# Patient Record
Sex: Female | Born: 1960 | Race: White | Hispanic: No | Smoking: Never smoker
Health system: Southern US, Community
[De-identification: ages and names within clinical notes are randomized; demographics above are authoritative.]

## PROBLEM LIST (undated history)

## (undated) DIAGNOSIS — K219 Gastro-esophageal reflux disease without esophagitis: Secondary | ICD-10-CM

## (undated) DIAGNOSIS — B019 Varicella without complication: Secondary | ICD-10-CM

## (undated) DIAGNOSIS — E079 Disorder of thyroid, unspecified: Secondary | ICD-10-CM

## (undated) DIAGNOSIS — G5603 Carpal tunnel syndrome, bilateral upper limbs: Secondary | ICD-10-CM

## (undated) DIAGNOSIS — G43909 Migraine, unspecified, not intractable, without status migrainosus: Secondary | ICD-10-CM

## (undated) HISTORY — DX: Migraine, unspecified, not intractable, without status migrainosus: G43.909

## (undated) HISTORY — DX: Gastro-esophageal reflux disease without esophagitis: K21.9

## (undated) HISTORY — DX: Varicella without complication: B01.9

## (undated) HISTORY — DX: Disorder of thyroid, unspecified: E07.9

## (undated) HISTORY — DX: Carpal tunnel syndrome, bilateral upper limbs: G56.03

---
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
## 1988-09-21 HISTORY — PX: HERNIA REPAIR: SHX51

## 2007-09-22 DIAGNOSIS — E039 Hypothyroidism, unspecified: Secondary | ICD-10-CM | POA: Insufficient documentation

## 2018-08-23 ENCOUNTER — Ambulatory Visit: Payer: BLUE CROSS/BLUE SHIELD | Admitting: Family Medicine

## 2018-08-23 ENCOUNTER — Encounter: Payer: Self-pay | Admitting: Family Medicine

## 2018-08-23 VITALS — BP 110/68 | HR 82 | Temp 98.3°F | Ht 64.0 in | Wt 165.6 lb

## 2018-08-23 DIAGNOSIS — E039 Hypothyroidism, unspecified: Secondary | ICD-10-CM

## 2018-08-23 DIAGNOSIS — Z1231 Encounter for screening mammogram for malignant neoplasm of breast: Secondary | ICD-10-CM

## 2018-08-23 LAB — CBC
HCT: 38.1 % (ref 36.0–46.0)
Hemoglobin: 12.7 g/dL (ref 12.0–15.0)
MCHC: 33.3 g/dL (ref 30.0–36.0)
MCV: 87.3 fl (ref 78.0–100.0)
PLATELETS: 228 10*3/uL (ref 150.0–400.0)
RBC: 4.37 Mil/uL (ref 3.87–5.11)
RDW: 13.6 % (ref 11.5–15.5)
WBC: 4.5 10*3/uL (ref 4.0–10.5)

## 2018-08-23 LAB — COMPREHENSIVE METABOLIC PANEL
ALT: 21 U/L (ref 0–35)
AST: 24 U/L (ref 0–37)
Albumin: 4.2 g/dL (ref 3.5–5.2)
Alkaline Phosphatase: 69 U/L (ref 39–117)
BUN: 12 mg/dL (ref 6–23)
CO2: 28 mEq/L (ref 19–32)
Calcium: 9.4 mg/dL (ref 8.4–10.5)
Chloride: 103 mEq/L (ref 96–112)
Creatinine, Ser: 0.8 mg/dL (ref 0.40–1.20)
GFR: 78.58 mL/min (ref >60.00)
Glucose, Bld: 93 mg/dL (ref 70–99)
POTASSIUM: 3.9 meq/L (ref 3.5–5.1)
Sodium: 138 mEq/L (ref 135–145)
Total Bilirubin: 0.6 mg/dL (ref 0.2–1.2)
Total Protein: 7.6 g/dL (ref 6.0–8.3)

## 2018-08-23 MED ORDER — LEVOTHYROXINE SODIUM 88 MCG PO TABS: 88.0000 ug | ORAL_TABLET | Freq: Every day | ORAL | 3 refills | Status: DC

## 2018-08-23 NOTE — Progress Notes (Signed)
Subjective:    Patient ID: Hannah [name], female    DOB: 1961/05/14, 57 y.o.   MRN: [id number]  HPI  Presents to clinic to establish care with PCP.  Patient's only medical history is hypothyroidism, she takes levothyroxine 88 mcg.  For the most part she is remained stable on this dose.  We will get new blood work today to continue monitoring her levels.  Last colonoscopy was in February 2019.  Last mammogram was November 2018.  Last Pap smear was January 2019.  Patient did get the flu shot for this season, last Tdap was in 2013.   Past medical history, social history, surgical history and family history reviewed and updated accordingly in chart: Family History  Problem Relation Age of Onset  . Arthritis Mother   . Hearing loss Mother   . Alcohol abuse Father   . Arthritis Father   . Cancer Father   . Depression Father   . Hearing loss Father   . Heart attack Father   . Hyperlipidemia Father   . Stroke Father   . Hyperlipidemia Sister   . Hyperlipidemia Brother   . Hypertension Brother    Social History   Tobacco Use  . Smoking status: Never Smoker  . Smokeless tobacco: Never Used  Substance Use Topics  . Alcohol use: Never    Frequency: Never   Past Surgical History:  Procedure Laterality Date  . HERNIA REPAIR  1990  . TONSILLECTOMY  1965   Past Medical History:  Diagnosis Date  . Chickenpox   . GERD (gastroesophageal reflux disease)   . Migraine   . Thyroid disease     ROS  Constitutional: Negative for chills, fatigue and fever.  HENT: Negative for congestion, ear pain, sinus pain and sore throat.   Eyes: Negative.   Respiratory: Negative for cough, shortness of breath and wheezing.   Cardiovascular: Negative for chest pain, palpitations and leg swelling.  Gastrointestinal: Negative for abdominal pain, diarrhea, nausea and vomiting.  Genitourinary: Negative for dysuria, frequency and urgency.  Musculoskeletal: Negative for arthralgias and myalgias.    Skin: Negative for color change, pallor and rash.  Neurological: Negative for syncope, light-headedness and headaches.  Psychiatric/Behavioral: The patient is not nervous/anxious.       Objective:    Physical Exam  Constitutional: She appears well-developed and well-nourished. No distress.  HENT:  Head: Normocephalic and atraumatic.  Eyes: Pupils are equal, round, and reactive to light. EOM are normal. No scleral icterus.  Neck: Normal range of motion. Neck supple. No tracheal deviation present. No thyromegaly. Cardiovascular: Normal rate, regular rhythm and normal heart sounds.  Pulmonary/Chest: Effort normal and breath sounds normal. No respiratory distress. She has no wheezes. She has no rales.  Abdominal: Soft. Bowel sounds are normal. There is no tenderness.  Neurological: She is alert and oriented to person, place, and time.  Gait normal  Skin: Skin is warm and dry. No pallor.  Psychiatric: She has a normal mood and affect. Her behavior is normal. Thought content normal.   Breast exam declined in clinic today.  Nursing note and vitals reviewed.    Vitals:   08/23/18 0840  BP: 110/68  Pulse: 82  Temp: 98.3 F (36.8 C)  SpO2: 97%   Assessment & Plan:   Hypothyroidism- we will get new lab work today to follow-up on thyroid levels.  And lab work will also include CBC, CMP and lipid panel.  Patient given refill of current levothyroxine dose.  Screening mammogram- patient  usually gets screening mammogram annually, mammogram order placed.  Follow up in 6 months. Return to clinic sooner if any issues arise.

## 2018-08-24 LAB — THYROID PANEL WITH TSH
FREE THYROXINE INDEX: 3.7 (ref 1.4–3.8)
T3 Uptake: 32 % (ref 22–35)
T4, Total: 11.5 ug/dL (ref 5.1–11.9)
TSH: 2.52 m[IU]/L (ref 0.40–4.50)

## 2018-09-12 ENCOUNTER — Telehealth: Payer: Self-pay | Admitting: *Deleted

## 2018-09-12 NOTE — Telephone Encounter (Signed)
This is fine with me   

## 2018-09-12 NOTE — Telephone Encounter (Signed)
FYI Pt is requesting to transfer FROM: Lauren Guse Pt is requesting to transfer TO: Margarett Arnett Reason for requested transfer: just doesn't seem like a good fit.  Send CRM to patient's current PCP (transferring FROM).

## 2018-09-12 NOTE — Telephone Encounter (Signed)
Copied from CRM [id number]. Topic: Appointment Scheduling - Transfer of Care >> Sep 12, 2018  3:51 PM Baldo DaubAlexander, Amber L wrote: Pt is requesting to transfer FROM: Lauren Guse Pt is requesting to transfer TO: Margarett Arnett Reason for requested transfer: just doesn't seem like a good fit.  Send CRM to patient's current PCP (transferring FROM).

## 2018-09-15 ENCOUNTER — Telehealth: Payer: Self-pay

## 2018-09-15 NOTE — Telephone Encounter (Signed)
Waiting for Arnett to approve, can not schedule until approval from her.

## 2018-09-15 NOTE — Telephone Encounter (Signed)
Copied from CRM [id number]. Topic: General - Call Back - No Documentation >> Sep 15, 2018  4:07 PM Angela NevinWilliams, Candice N wrote: Reason for CRM: Patient is returning call to office- no documentation

## 2018-09-16 NOTE — Telephone Encounter (Signed)
okay

## 2018-10-10 ENCOUNTER — Encounter: Payer: Self-pay | Admitting: Family

## 2018-10-24 ENCOUNTER — Other Ambulatory Visit: Payer: Self-pay | Admitting: Family Medicine

## 2018-10-24 DIAGNOSIS — Z1231 Encounter for screening mammogram for malignant neoplasm of breast: Secondary | ICD-10-CM

## 2018-11-16 ENCOUNTER — Ambulatory Visit
Admission: RE | Admit: 2018-11-16 | Discharge: 2018-11-16 | Disposition: A | Discharge status: Home / Self Care | Payer: BLUE CROSS/BLUE SHIELD | Source: Ambulatory Visit | Attending: Family Medicine | Admitting: Family Medicine

## 2018-11-16 ENCOUNTER — Encounter (HOSPITAL_COMMUNITY): Payer: Self-pay

## 2018-11-16 DIAGNOSIS — Z1231 Encounter for screening mammogram for malignant neoplasm of breast: Secondary | ICD-10-CM | POA: Diagnosis not present

## 2018-11-22 NOTE — Progress Notes (Signed)
Called [facility] for Pt results, and they stated they are waiting on  Pt past results from [place].

## 2019-02-24 ENCOUNTER — Telehealth: Payer: Self-pay | Admitting: Family

## 2019-02-24 ENCOUNTER — Other Ambulatory Visit: Payer: Self-pay

## 2019-02-24 DIAGNOSIS — E039 Hypothyroidism, unspecified: Secondary | ICD-10-CM

## 2019-02-24 MED ORDER — LEVOTHYROXINE SODIUM 88 MCG PO TABS: 88.0000 ug | ORAL_TABLET | Freq: Every day | ORAL | 3 refills | Status: DC

## 2019-02-24 NOTE — Telephone Encounter (Signed)
I have refilled and sent to [facility].

## 2019-02-24 NOTE — Telephone Encounter (Signed)
Copied from CRM [id number]. Topic: Quick Communication - Rx Refill/Question >> Feb 24, 2019 11:12 AM Jay Schlichter wrote: Medication:levothyroxine (SYNTHROID, LEVOTHROID) 88 MCG tablet  Has the patient contacted their pharmacy? Yes.   (Agent: If no, request that the patient contact the pharmacy for the refill.) (Agent: If yes, when and what did the pharmacy advise?)  Preferred Pharmacy (with phone number or street name): [facility]   Agent: Please be advised that RX refills may take up to 3 business days. We ask that you follow-up with your pharmacy.

## 2019-02-24 NOTE — Telephone Encounter (Signed)
Refill synthroid

## 2019-11-23 ENCOUNTER — Other Ambulatory Visit: Payer: Self-pay

## 2019-11-28 ENCOUNTER — Other Ambulatory Visit: Payer: Self-pay

## 2019-11-28 ENCOUNTER — Ambulatory Visit: Payer: BC Managed Care – PPO | Admitting: Nurse Practitioner

## 2019-11-28 ENCOUNTER — Encounter: Payer: Self-pay | Admitting: Nurse Practitioner

## 2019-11-28 VITALS — BP 114/68 | HR 84 | Temp 97.5°F | Resp 16 | Ht 65.0 in | Wt 167.4 lb

## 2019-11-28 DIAGNOSIS — G5603 Carpal tunnel syndrome, bilateral upper limbs: Secondary | ICD-10-CM | POA: Diagnosis not present

## 2019-11-28 DIAGNOSIS — E039 Hypothyroidism, unspecified: Secondary | ICD-10-CM | POA: Diagnosis not present

## 2019-11-28 DIAGNOSIS — I83811 Varicose veins of right lower extremities with pain: Secondary | ICD-10-CM | POA: Insufficient documentation

## 2019-11-28 DIAGNOSIS — K219 Gastro-esophageal reflux disease without esophagitis: Secondary | ICD-10-CM | POA: Insufficient documentation

## 2019-11-28 DIAGNOSIS — I8391 Asymptomatic varicose veins of right lower extremity: Secondary | ICD-10-CM

## 2019-11-28 DIAGNOSIS — M189 Osteoarthritis of first carpometacarpal joint, unspecified: Secondary | ICD-10-CM | POA: Diagnosis not present

## 2019-11-28 HISTORY — DX: Hypothyroidism, unspecified: E03.9

## 2019-11-28 HISTORY — DX: Asymptomatic varicose veins of right lower extremity: I83.91

## 2019-11-28 LAB — COMPREHENSIVE METABOLIC PANEL
ALT: 10 U/L (ref 0–35)
AST: 17 U/L (ref 0–37)
Albumin: 4 g/dL (ref 3.5–5.2)
Alkaline Phosphatase: 75 U/L (ref 39–117)
BUN: 11 mg/dL (ref 6–23)
CO2: 30 mEq/L (ref 19–32)
Calcium: 9.2 mg/dL (ref 8.4–10.5)
Chloride: 104 mEq/L (ref 96–112)
Creatinine, Ser: 0.77 mg/dL (ref 0.40–1.20)
GFR: 76.92 mL/min (ref >60.00)
Glucose, Bld: 91 mg/dL (ref 70–99)
Potassium: 4 mEq/L (ref 3.5–5.1)
Sodium: 139 mEq/L (ref 135–145)
Total Bilirubin: 0.5 mg/dL (ref 0.2–1.2)
Total Protein: 6.9 g/dL (ref 6.0–8.3)

## 2019-11-28 LAB — TSH: TSH: 2.5 u[IU]/mL (ref 0.35–4.50)

## 2019-11-28 LAB — LIPID PANEL
Cholesterol: 197 mg/dL (ref 0–200)
HDL: 43.4 mg/dL (ref >39.00)
LDL Cholesterol: 139 mg/dL — ABNORMAL HIGH (ref 0–99)
NonHDL: 153.3
Total CHOL/HDL Ratio: 5
Triglycerides: 74 mg/dL (ref 0.0–149.0)
VLDL: 14.8 mg/dL (ref 0.0–40.0)

## 2019-11-28 LAB — CBC
HCT: 35.9 % — ABNORMAL LOW (ref 36.0–46.0)
Hemoglobin: 12 g/dL (ref 12.0–15.0)
MCHC: 33.5 g/dL (ref 30.0–36.0)
MCV: 87.5 fl (ref 78.0–100.0)
Platelets: 202 10*3/uL (ref 150.0–400.0)
RBC: 4.11 Mil/uL (ref 3.87–5.11)
RDW: 13.6 % (ref 11.5–15.5)
WBC: 3.9 10*3/uL — ABNORMAL LOW (ref 4.0–10.5)

## 2019-11-28 MED ORDER — LEVOTHYROXINE SODIUM 88 MCG PO TABS: 88.0000 ug | ORAL_TABLET | Freq: Every day | ORAL | 3 refills | Status: DC

## 2019-11-28 NOTE — Patient Instructions (Addendum)
It was wonderful to meet you today.  1. Please go to lab today. Will check thyroid and routine labs.  2. I placed referral for Dr. Wyn Quaker- in vascular to check your varicose veins. Let us know if you have not heard from their office in 2 weeks. 3. Please get GI records if able.  4. Please follow up for physical exam in 3 mos .  Addenum:  Your chemistry panel and thyroid labs returned normal. Your LDL was elevated on the cholesterol panel. This needs to be treated with low-fat diet and exercise and repeated as a fasting lipid panel in 3 mos.  The WBC was slightly low- and this is a dynamic value. Sometimes we see it when it is at its low point and then on re-check it is in the normal range. I would repeat the CBC in 3 months.   Continue on your same levothyroxine dose and take it  every day, with water, >30 minutes before breakfast, separated by >4 hours from acid reflux medications, calcium, iron, multivitamins.

## 2019-11-28 NOTE — Progress Notes (Signed)
Subjective:    Patient ID: Hannah [name], female    DOB: 22-Feb-1961, 59 y.o.   MRN: [id number]  HPI This 59 yo patient comes in to establish care and get refills of her thyroid medicine.  Hypothyroidism:  She is on levothyroxine 88 mcg and is doing well without any side effects.   GERD reports EGD in 2019-takes Prilosec only a needed and is doing well. She reports esophageal scar tissue.   History of  polyps per colonoscopy  with 10 year follow up reported. FH sister with colon polyps on 5 year plan.    Varicose veins Rt leg- 2018 vein twist and no DVT. She saw vascular doctor >25 years ago. She gets pain after being on her feet 10.5 hours per day. She no longer wears support hose.   Carpal tunnel- seeing hand surgeon and plans to have bilateral surgery scheduled.   Past Medical History:  Diagnosis Date  . Carpal tunnel syndrome, bilateral   . Chickenpox   . GERD (gastroesophageal reflux disease)   . Hypothyroidism 11/28/2019  . Migraine   . Thyroid disease   . Varicose veins of right lower extremity 11/28/2019     Past Surgical History:  Procedure Laterality Date  . HERNIA REPAIR  1990  . TONSILLECTOMY  1965    Family History  Problem Relation Age of Onset  . Arthritis Mother   . Hearing loss Mother   . Alcohol abuse Father   . Arthritis Father   . Cancer Father   . Depression Father   . Hearing loss Father   . Heart attack Father   . Hyperlipidemia Father   . Stroke Father   . Hyperlipidemia Sister   . Colon polyps Sister   . Hyperlipidemia Brother   . Hypertension Brother   . Breast cancer Paternal Aunt     Allergies  Allergen Reactions  . Codeine Nausea And Vomiting and Rash     current outpatient medications on file prior to visit.  Levothyroxine 88 mcg daily   BP 114/68   Pulse 84   Temp (!) 97.5 F (36.4 C) (Temporal)   Resp 16   Ht 5\' 5"  (1.651 m)   Wt 167 lb 6.4 oz (75.9 kg)   SpO2 96%   BMI 27.86 kg/m    Review of Systems    Constitutional: Negative for activity change, chills, fatigue and fever.  HENT: Negative for congestion, sinus pressure, sore throat and trouble swallowing.   Respiratory: Negative for cough and shortness of breath.   Cardiovascular: Positive for leg swelling. Negative for chest pain and palpitations.       Tight shoes if on feet >2 hours. Goes down in the morning.   Gastrointestinal: Negative for abdominal pain, constipation, diarrhea, nausea and vomiting.  Endocrine: Positive for cold intolerance. Negative for heat intolerance, polydipsia and polyuria.  Genitourinary: Negative for difficulty urinating, frequency and urgency.  Musculoskeletal:       Carpal tunnel bilat onset 18 years seeing ortho surgeon as she needs it fixed.  Skin: Negative.   Neurological: Negative.   Hematological: Negative.   Psychiatric/Behavioral:       Denies concerns about depression/anxiety. No suicidal and homicidal ideation.       Objective:   Physical Exam Vitals reviewed.  Constitutional:      Appearance: Normal appearance.  HENT:     Head: Normocephalic.  Cardiovascular:     Rate and Rhythm: Normal rate and regular rhythm.     Heart sounds:  Normal heart sounds.  Pulmonary:     Effort: Pulmonary effort is normal.     Breath sounds: Normal breath sounds.  Abdominal:     Palpations: Abdomen is soft.     Tenderness: There is no abdominal tenderness.  Musculoskeletal:        General: Normal range of motion.     Cervical back: Normal range of motion.     Comments: Large varicose veins right lower leg/ankle   Skin:    General: Skin is warm and dry.  Neurological:     General: No focal deficit present.     Mental Status: She is alert and oriented to person, place, and time.  Psychiatric:        Mood and Affect: Mood normal.     Comments: Tearful when discussing ex husband and his health problems.        Assessment & Plan:   1. Hypothyroidism: labs today and refilled levothyroxine Rx.  2.  Varicose veins- large, minimally bothersome.referral for Dr. Lucky Cowboy- in vascular to check your varicose veins. Let us know if you have not heard from their office in 2 weeks.  3. Hx of GERD- occasional PPI 4. FH colon polyps in sister. Hannah [name] had a colonoscopy in 2019 and was told to repeat  in 10 years. Please get GI records if able.  5.  Please follow up for physical exam in 3 mos   This visit occurred during the SARS-CoV-2 public health emergency.  Safety protocols were in place, including screening questions prior to the visit, additional usage of staff PPE, and extensive cleaning of exam room while observing appropriate contact time as indicated for disinfecting solutions.   I spent 40 min face to face time with this patient.   Denice Paradise, ANP

## 2019-11-29 ENCOUNTER — Other Ambulatory Visit: Payer: Self-pay | Admitting: Lab

## 2019-11-29 ENCOUNTER — Encounter: Payer: Self-pay | Admitting: Nurse Practitioner

## 2020-01-03 ENCOUNTER — Encounter: Payer: BLUE CROSS/BLUE SHIELD | Admitting: Family

## 2020-01-15 ENCOUNTER — Other Ambulatory Visit (INDEPENDENT_AMBULATORY_CARE_PROVIDER_SITE_OTHER): Payer: Self-pay | Admitting: Vascular Surgery

## 2020-01-15 DIAGNOSIS — I8391 Asymptomatic varicose veins of right lower extremity: Secondary | ICD-10-CM

## 2020-01-16 ENCOUNTER — Encounter (INDEPENDENT_AMBULATORY_CARE_PROVIDER_SITE_OTHER): Payer: Self-pay

## 2020-01-16 ENCOUNTER — Other Ambulatory Visit: Payer: Self-pay

## 2020-01-16 ENCOUNTER — Ambulatory Visit (INDEPENDENT_AMBULATORY_CARE_PROVIDER_SITE_OTHER): Payer: BC Managed Care – PPO

## 2020-01-16 ENCOUNTER — Ambulatory Visit (INDEPENDENT_AMBULATORY_CARE_PROVIDER_SITE_OTHER): Payer: BC Managed Care – PPO | Admitting: Vascular Surgery

## 2020-01-16 ENCOUNTER — Encounter (INDEPENDENT_AMBULATORY_CARE_PROVIDER_SITE_OTHER): Payer: Self-pay | Admitting: Vascular Surgery

## 2020-01-16 VITALS — BP 121/79 | HR 74 | Resp 17 | Ht 65.0 in | Wt 160.0 lb

## 2020-01-16 DIAGNOSIS — I83811 Varicose veins of right lower extremities with pain: Secondary | ICD-10-CM | POA: Diagnosis not present

## 2020-01-16 DIAGNOSIS — I8391 Asymptomatic varicose veins of right lower extremity: Secondary | ICD-10-CM | POA: Diagnosis not present

## 2020-01-16 NOTE — Patient Instructions (Signed)

## 2020-01-16 NOTE — Progress Notes (Signed)
Patient ID: Hannah [name], female   DOB: 02-16-1961, 59 y.o.   MRN: [id number]  Chief Complaint  Patient presents with  . Follow-up    ultrasound    HPI Hannah [name] is a 59 y.o. female.  I am asked to see the patient by K. Jerelene Redden, NP for evaluation of painful varicose veins of the right lower extremity.  This has been something present for a good period of time is become more bothersome over the last year or 2.  No previous history of DVT or superficial thrombophlebitis.  No chest pain or shortness of breath.  No symptoms on the left leg.  This is associated with swelling and heaviness in the right leg.  The symptoms are worse in the.  Reflux study done today shows no evidence of DVT or superficial thrombophlebitis.  The great saphenous vein does not have reflux.  The small saphenous vein has severe reflux extending into the popliteal vein with reflux and is enlarged.  She has already been wearing compression stockings, elevating her legs, and maintaining a good activity level   Past Medical History:  Diagnosis Date  . Carpal tunnel syndrome, bilateral   . Chickenpox   . GERD (gastroesophageal reflux disease)   . Hypothyroidism 11/28/2019  . Migraine   . Thyroid disease   . Varicose veins of right lower extremity 11/28/2019    Past Surgical History:  Procedure Laterality Date  . HERNIA REPAIR  1990  . TONSILLECTOMY  1965     Family History  Problem Relation Age of Onset  . Arthritis Mother   . Hearing loss Mother   . Alcohol abuse Father   . Arthritis Father   . Cancer Father   . Depression Father   . Hearing loss Father   . Heart attack Father   . Hyperlipidemia Father   . Stroke Father   . Hyperlipidemia Sister   . Colon polyps Sister   . Hyperlipidemia Brother   . Hypertension Brother   . Breast cancer Paternal Aunt       Social History   Tobacco Use  . Smoking status: Never Smoker  . Smokeless tobacco: Never Used  Substance Use Topics  . Alcohol use: Never    . Drug use: Never    Allergies  Allergen Reactions  . Codeine Nausea And Vomiting and Rash    Current Outpatient Medications  Medication Sig Dispense Refill  . levothyroxine (SYNTHROID) 88 MCG tablet Take 1 tablet (88 mcg total) by mouth daily. 90 tablet 3   No current facility-administered medications for this visit.      REVIEW OF SYSTEMS (Negative unless checked)  Constitutional: '[]' Weight loss  '[]' Fever  '[]' Chills Cardiac: '[]' Chest pain   '[]' Chest pressure   '[]' Palpitations   '[]' Shortness of breath when laying flat   '[]' Shortness of breath at rest   '[]' Shortness of breath with exertion. Vascular:  '[]' Pain in legs with walking   '[]' Pain in legs at rest   '[]' Pain in legs when laying flat   '[]' Claudication   '[]' Pain in feet when walking  '[]' Pain in feet at rest  '[]' Pain in feet when laying flat   '[]' History of DVT   '[]' Phlebitis   '[x]' Swelling in legs   '[x]' Varicose veins   '[]' Non-healing ulcers Pulmonary:   '[]' Uses home oxygen   '[]' Productive cough   '[]' Hemoptysis   '[]' Wheeze  '[]' COPD   '[]' Asthma Neurologic:  '[]' Dizziness  '[]' Blackouts   '[]' Seizures   '[]' History of stroke   '[]' History of TIA  '[]'   Aphasia   '[]' Temporary blindness   '[]' Dysphagia   '[]' Weakness or numbness in arms   '[]' Weakness or numbness in legs Musculoskeletal:  '[]' Arthritis   '[]' Joint swelling   '[]' Joint pain   '[]' Low back pain Hematologic:  '[]' Easy bruising  '[]' Easy bleeding   '[]' Hypercoagulable state   '[]' Anemic  '[]' Hepatitis Gastrointestinal:  '[]' Blood in stool   '[]' Vomiting blood  '[x]' Gastroesophageal reflux/heartburn   '[]' Abdominal pain Genitourinary:  '[]' Chronic kidney disease   '[]' Difficult urination  '[]' Frequent urination  '[]' Burning with urination   '[]' Hematuria Skin:  '[]' Rashes   '[]' Ulcers   '[]' Wounds Psychological:  '[]' History of anxiety   '[]'  History of major depression.    Physical Exam BP 121/79 (BP Location: Right Arm)   Pulse 74   Resp 17   Ht '5\' 5"'  (1.651 m)   Wt 160 lb (72.6 kg)   BMI 26.63 kg/m  Gen:  WD/WN, NAD. Appears younger than stated  age. Head: [place]/AT, No temporalis wasting.  Ear/Nose/Throat: Hearing grossly intact, nares w/o erythema or drainage, oropharynx w/o Erythema/Exudate Eyes: Conjunctiva clear, sclera non-icteric  Neck: trachea midline.  No JVD.  Pulmonary:  Good air movement, respirations not labored, no use of accessory muscles  Cardiac: RRR, no JVD Vascular: Extensive, prominent varicosities in the right posterior calf which are tortuous and extend from the proximal calf to the ankle level. Vessel Right Left  Radial Palpable Palpable                          DP 2+ 2+  PT 1+ 2+   Gastrointestinal:. No masses, surgical incisions, or scars. Musculoskeletal: M/S 5/5 throughout.  Extremities without ischemic changes.  No deformity or atrophy. 1+ RLE edema. Neurologic: Sensation grossly intact in extremities.  Symmetrical.  Speech is fluent. Motor exam as listed above. Psychiatric: Judgment intact, Mood & affect appropriate for pt's clinical situation. Dermatologic: No rashes or ulcers noted.  No cellulitis or open wounds.    Radiology No results found.  Labs Recent Results (from the past 2160 hour(s))  CBC     Status: Abnormal   Collection Time: 11/28/19  8:57 AM  Result Value Ref Range   WBC 3.9 (L) 4.0 - 10.5 K/uL   RBC 4.11 3.87 - 5.11 Mil/uL   Platelets 202.0 150.0 - 400.0 K/uL   Hemoglobin 12.0 12.0 - 15.0 g/dL   HCT 35.9 (L) 36.0 - 46.0 %   MCV 87.5 78.0 - 100.0 fl   MCHC 33.5 30.0 - 36.0 g/dL   RDW 13.6 11.5 - 15.5 %  Comp Met (CMET)     Status: None   Collection Time: 11/28/19  8:57 AM  Result Value Ref Range   Sodium 139 135 - 145 mEq/L   Potassium 4.0 3.5 - 5.1 mEq/L   Chloride 104 96 - 112 mEq/L   CO2 30 19 - 32 mEq/L   Glucose, Bld 91 70 - 99 mg/dL   BUN 11 6 - 23 mg/dL   Creatinine, Ser 0.77 0.40 - 1.20 mg/dL   Total Bilirubin 0.5 0.2 - 1.2 mg/dL   Alkaline Phosphatase 75 39 - 117 U/L   AST 17 0 - 37 U/L   ALT 10 0 - 35 U/L   Total Protein 6.9 6.0 - 8.3 g/dL   Albumin  4.0 3.5 - 5.2 g/dL   GFR 76.92 >60.00 mL/min   Calcium 9.2 8.4 - 10.5 mg/dL  TSH     Status: None   Collection Time: 11/28/19  8:57  AM  Result Value Ref Range   TSH 2.50 0.35 - 4.50 uIU/mL  Lipid Profile     Status: Abnormal   Collection Time: 11/28/19  8:57 AM  Result Value Ref Range   Cholesterol 197 0 - 200 mg/dL    Comment: ATP III Classification       Desirable:  < 200 mg/dL               Borderline High:  200 - 239 mg/dL          High:  > = 240 mg/dL   Triglycerides 74.0 0.0 - 149.0 mg/dL    Comment: Normal:  <150 mg/dLBorderline High:  150 - 199 mg/dL   HDL 43.40 >39.00 mg/dL   VLDL 14.8 0.0 - 40.0 mg/dL   LDL Cholesterol 139 (H) 0 - 99 mg/dL   Total CHOL/HDL Ratio 5     Comment:                Men          Women1/2 Average Risk     3.4          3.3Average Risk          5.0          4.42X Average Risk          9.6          7.13X Average Risk          15.0          11.0                       NonHDL 153.30     Comment: NOTE:  Non-HDL goal should be 30 mg/dL higher than patient's LDL goal (i.e. LDL goal of < 70 mg/dL, would have non-HDL goal of < 100 mg/dL)    Assessment/Plan:  Varicose veins of leg with pain, right Reflux study done today shows no evidence of DVT or superficial thrombophlebitis.  The great saphenous vein does not have reflux.  The small saphenous vein has severe reflux extending into the popliteal vein with reflux and is enlarged.   Although she has already started conservative therapies, with this being her initial visit with [place] we will continue the conservative therapies of 20 to 30 mmHg compression stockings, leg elevation, vigorous activity, and anti-inflammatories as needed for discomfort.  We did discuss laser ablation of the small saphenous vein if she does not have improvement with conservative therapies, I would recommend right small saphenous vein laser ablation.      Leotis Pain 01/16/2020, 4:50 PM   This note was created with Dragon medical  transcription system.  Any errors from dictation are unintentional.

## 2020-01-16 NOTE — Assessment & Plan Note (Signed)
Reflux study done today shows no evidence of DVT or superficial thrombophlebitis.  The great saphenous vein does not have reflux.  The small saphenous vein has severe reflux extending into the popliteal vein with reflux and is enlarged.   Although she has already started conservative therapies, with this being her initial visit with [place] we will continue the conservative therapies of 20 to 30 mmHg compression stockings, leg elevation, vigorous activity, and anti-inflammatories as needed for discomfort.  We did discuss laser ablation of the small saphenous vein if she does not have improvement with conservative therapies, I would recommend right small saphenous vein laser ablation.

## 2020-02-08 ENCOUNTER — Other Ambulatory Visit: Payer: Self-pay | Admitting: Nurse Practitioner

## 2020-02-08 DIAGNOSIS — Z1231 Encounter for screening mammogram for malignant neoplasm of breast: Secondary | ICD-10-CM

## 2020-02-08 DIAGNOSIS — M180 Bilateral primary osteoarthritis of first carpometacarpal joints: Secondary | ICD-10-CM | POA: Diagnosis not present

## 2020-02-09 ENCOUNTER — Other Ambulatory Visit: Payer: Self-pay | Admitting: Family

## 2020-02-09 DIAGNOSIS — E039 Hypothyroidism, unspecified: Secondary | ICD-10-CM

## 2020-02-20 ENCOUNTER — Ambulatory Visit
Admission: RE | Admit: 2020-02-20 | Discharge: 2020-02-20 | Disposition: A | Discharge status: Home / Self Care | Payer: BC Managed Care – PPO | Source: Ambulatory Visit | Attending: Nurse Practitioner | Admitting: Nurse Practitioner

## 2020-02-20 DIAGNOSIS — Z1231 Encounter for screening mammogram for malignant neoplasm of breast: Secondary | ICD-10-CM | POA: Diagnosis not present

## 2020-02-21 ENCOUNTER — Ambulatory Visit: Payer: BC Managed Care – PPO | Admitting: Nurse Practitioner

## 2020-02-21 ENCOUNTER — Encounter: Payer: Self-pay | Admitting: Nurse Practitioner

## 2020-02-21 ENCOUNTER — Other Ambulatory Visit: Payer: Self-pay

## 2020-02-21 ENCOUNTER — Other Ambulatory Visit: Payer: BC Managed Care – PPO

## 2020-02-21 VITALS — BP 110/70 | HR 75 | Temp 97.6°F | Ht 65.0 in | Wt 162.2 lb

## 2020-02-21 DIAGNOSIS — R7989 Other specified abnormal findings of blood chemistry: Secondary | ICD-10-CM | POA: Diagnosis not present

## 2020-02-21 DIAGNOSIS — E039 Hypothyroidism, unspecified: Secondary | ICD-10-CM

## 2020-02-21 DIAGNOSIS — E663 Overweight: Secondary | ICD-10-CM | POA: Diagnosis not present

## 2020-02-21 DIAGNOSIS — E785 Hyperlipidemia, unspecified: Secondary | ICD-10-CM | POA: Diagnosis not present

## 2020-02-21 DIAGNOSIS — K219 Gastro-esophageal reflux disease without esophagitis: Secondary | ICD-10-CM

## 2020-02-21 LAB — CBC WITH DIFFERENTIAL/PLATELET
Basophils Absolute: 0.1 10*3/uL (ref 0.0–0.1)
Basophils Relative: 1.2 % (ref 0.0–3.0)
Eosinophils Absolute: 0.1 10*3/uL (ref 0.0–0.7)
Eosinophils Relative: 2.3 % (ref 0.0–5.0)
HCT: 36.5 % (ref 36.0–46.0)
Hemoglobin: 12.2 g/dL (ref 12.0–15.0)
Lymphocytes Relative: 26.5 % (ref 12.0–46.0)
Lymphs Abs: 1.2 10*3/uL (ref 0.7–4.0)
MCHC: 33.5 g/dL (ref 30.0–36.0)
MCV: 86.8 fl (ref 78.0–100.0)
Monocytes Absolute: 0.7 10*3/uL (ref 0.1–1.0)
Monocytes Relative: 14 % — ABNORMAL HIGH (ref 3.0–12.0)
Neutro Abs: 2.6 10*3/uL (ref 1.4–7.7)
Neutrophils Relative %: 56 % (ref 43.0–77.0)
Platelets: 215 10*3/uL (ref 150.0–400.0)
RBC: 4.2 Mil/uL (ref 3.87–5.11)
RDW: 14.1 % (ref 11.5–15.5)
WBC: 4.7 10*3/uL (ref 4.0–10.5)

## 2020-02-21 LAB — LIPID PANEL
Cholesterol: 203 mg/dL — ABNORMAL HIGH (ref 0–200)
HDL: 45.5 mg/dL (ref >39.00)
LDL Cholesterol: 147 mg/dL — ABNORMAL HIGH (ref 0–99)
NonHDL: 157.61
Total CHOL/HDL Ratio: 4
Triglycerides: 55 mg/dL (ref 0.0–149.0)
VLDL: 11 mg/dL (ref 0.0–40.0)

## 2020-02-21 NOTE — Assessment & Plan Note (Signed)
Recheck labs 

## 2020-02-21 NOTE — Progress Notes (Signed)
Established Patient Office Visit  Subjective:  Patient ID: Hannah [name], female    DOB: [birth year]  Age: 59 y.o. MRN: [id number]  CC:  Chief Complaint  Patient presents with  . Follow-up    HPI Hannah [name] presents for a 3 month visit. She is feeling well today without any specific complaints.  Hypothyroidism: stable on levothyroxine 88 mcg daily  Lab Results  Component Value Date   TSH 2.50 11/28/2019    HLD-not at goal with LDL <100 Overweight: BMI 26.99: Goal BMI <25 Patient with elevated LDL 139-147 range. Declines Crestor and is working on [employer] and exercise management. Lab Results  Component Value Date   CHOL 203 (H) 02/21/2020   HDL 45.50 02/21/2020   LDLCALC 147 (H) 02/21/2020   TRIG 55.0 02/21/2020   CHOLHDL 4 02/21/2020    Varicose veins: Saw Dr. Lucky Cowboy and placed her on low dose ASA. Plans to have laser ablation of varicose veins.   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 CTD- to be monitored . Cortisone into bilat thumb joints last Wed and it has helped and wears braces at night and at work.   Health maintenance: Immunizations: Covid x2- not sure of date, Needs Shingirx, not sure about TD, no PNA.  Diet:healthy Exercise: yes Colonoscopy:Colonoscopy 2019 [place]. Get records [facility] Dexa: Pap Smear: making appt with Dr. Star Age  Mammogram: 02/20/2020;results pending Vision: needs done Dentist UTD   Past Medical History:  Diagnosis Date  . Carpal tunnel syndrome, bilateral   . Chickenpox   . GERD (gastroesophageal reflux disease)   . Hypothyroidism 11/28/2019  . Migraine   . Thyroid disease   . Varicose veins of right lower extremity 11/28/2019    Past Surgical History:  Procedure Laterality Date  . HERNIA REPAIR  1990  . TONSILLECTOMY  1965    Family History  Problem Relation Age of Onset  . Arthritis Mother   . Hearing loss Mother   . Alcohol abuse Father   . Arthritis Father     . Cancer Father   . Depression Father   . Hearing loss Father   . Heart attack Father   . Hyperlipidemia Father   . Stroke Father   . Hyperlipidemia Sister   . Colon polyps Sister   . Hyperlipidemia Brother   . Hypertension Brother   . Breast cancer Paternal Aunt     Social History   Socioeconomic History  . Marital status: Legally Separated    Spouse name: Not on file  . Number of children: Not on file  . Years of education: Not on file  . Highest education level: Not on file  Occupational History  . Not on file  Tobacco Use  . Smoking status: Never Smoker  . Smokeless tobacco: Never Used  Substance and Sexual Activity  . Alcohol use: Never  . Drug use: Never  . Sexual activity: Not Currently  Other Topics Concern  . Not on file  Social History Narrative  . Not on file   Social Determinants of Health   Financial Resource Strain:   . Difficulty of Paying Living Expenses:   Food Insecurity:   . Worried About Charity fundraiser in the Last Year:   . Arboriculturist in the Last Year:   Transportation Needs:   . Film/video editor (Medical):   Marland Kitchen Lack of Transportation (Non-Medical):   Physical Activity:   . Days of  Exercise per Week:   . Minutes of Exercise per Session:   Stress:   . Feeling of Stress :   Social Connections:   . Frequency of Communication with Friends and Family:   . Frequency of Social Gatherings with Friends and Family:   . Attends Religious Services:   . Active Member of Clubs or Organizations:   . Attends Banker Meetings:   Marland Kitchen Marital Status:   Intimate Partner Violence:   . Fear of Current or Ex-Partner:   . Emotionally Abused:   Marland Kitchen Physically Abused:   . Sexually Abused:     Outpatient Medications Prior to Visit  Medication Sig Dispense Refill  . aspirin EC 81 MG tablet Take 81 mg by mouth daily.    Marland Kitchen levothyroxine (SYNTHROID) 88 MCG tablet TAKE 1 TABLET DAILY 90 tablet 3  . pyridOXINE (VITAMIN B-6) 100 MG  tablet Take 100 mg by mouth daily.     No facility-administered medications prior to visit.    Allergies  Allergen Reactions  . Codeine Nausea And Vomiting and Rash    ROS Review of Systems  Constitutional: Negative for fatigue and fever.  HENT: Negative for congestion and sore throat.   Eyes: Negative.   Respiratory: Negative for cough and shortness of breath.   Cardiovascular: Negative for chest pain and leg swelling.  Gastrointestinal: Negative for abdominal pain.  Endocrine: Negative.   Genitourinary: Negative for difficulty urinating.  Musculoskeletal: Positive for arthralgias.  Skin: Negative for rash.  Neurological: Negative.   Hematological: Negative.   Psychiatric/Behavioral: Negative.       Objective:    Physical Exam  Constitutional: She is oriented to person, place, and time. She appears well-developed and well-nourished.  HENT:  Head: Normocephalic and atraumatic.  Cardiovascular: Normal rate, regular rhythm, normal heart sounds and intact distal pulses.  Pulmonary/Chest: Effort normal and breath sounds normal.  Abdominal: Soft. There is no abdominal tenderness.  Musculoskeletal:        General: Normal range of motion.     Cervical back: Normal range of motion and neck supple.  Neurological: She is alert and oriented to person, place, and time.  Skin: Skin is warm and dry.  Mild sun burn face superimposed on mask irritation   Psychiatric: She has a normal mood and affect. Her behavior is normal. Judgment and thought content normal.  Vitals reviewed.   BP 110/70 (BP Location: Left Arm, Patient Position: Sitting, Cuff Size: Normal)   Pulse 75   Temp 97.6 F (36.4 C) (Skin)   Ht 5\' 5"  (1.651 m)   Wt 162 lb 3.2 oz (73.6 kg)   SpO2 98%   BMI 26.99 kg/m  Wt Readings from Last 3 Encounters:  02/21/20 162 lb 3.2 oz (73.6 kg)  01/16/20 160 lb (72.6 kg)  11/28/19 167 lb 6.4 oz (75.9 kg)     Health Maintenance Due  Topic Date Due  . Hepatitis C  Screening  Never done  . HIV Screening  Never done    There are no preventive care reminders to display for this patient.  Lab Results  Component Value Date   TSH 2.50 11/28/2019   Lab Results  Component Value Date   WBC 4.7 02/21/2020   HGB 12.2 02/21/2020   HCT 36.5 02/21/2020   MCV 86.8 02/21/2020   PLT 215.0 02/21/2020   Lab Results  Component Value Date   NA 139 11/28/2019   K 4.0 11/28/2019   CO2 30 11/28/2019   GLUCOSE  91 11/28/2019   BUN 11 11/28/2019   CREATININE 0.77 11/28/2019   BILITOT 0.5 11/28/2019   ALKPHOS 75 11/28/2019   AST 17 11/28/2019   ALT 10 11/28/2019   PROT 6.9 11/28/2019   ALBUMIN 4.0 11/28/2019   CALCIUM 9.2 11/28/2019   GFR 76.92 11/28/2019   Lab Results  Component Value Date   CHOL 203 (H) 02/21/2020   Lab Results  Component Value Date   HDL 45.50 02/21/2020   Lab Results  Component Value Date   LDLCALC 147 (H) 02/21/2020   Lab Results  Component Value Date   TRIG 55.0 02/21/2020   Lab Results  Component Value Date   CHOLHDL 4 02/21/2020   No results found for: HGBA1C    Assessment & Plan:   Problem List Items Addressed This Visit      Digestive   GERD (gastroesophageal reflux disease)    Hx of GERD and rarely needs AA or PPI.         Endocrine   Hypothyroidism - Primary     Other   Abnormal CBC    Recheck labs       Relevant Orders   CBC with Differential/Platelet (Completed)   Hyperlipidemia    HLD-not at goal with LDL <100 Patient with elevated LDL 139-147 range. Declines Crestor and is working on [employer] and exercise management.       Relevant Medications   aspirin EC 81 MG tablet   Other Relevant Orders   Lipid Profile (Completed)   Hepatic function panel   Lipid Profile   Overweight (BMI 25.0-29.9)    Overweight: BMI 26.99: Goal BMI <25. Advise a  low-fat, low carb, healthy diet lots of fruits vegetables, lean meats, low-fat dairy.Exercise.          Lab today to recheck the cbc and  lipids.   Call back if you would like a DERMATOLOGY referral for sunburn-face-maybe developing rosacea.   Preventative counseling Please follow low-fat, healthy diet lots of fruits vegetables, lean meats, low-fat dairy.Exercise.   She will get records of colonoscopy and her PCP last notes. Also, get PCP records for past screening for HCV and HIV for our records.   Recommend vaccination: Shingrix- considering.    No orders of the defined types were placed in this encounter. This visit occurred during the SARS-CoV-2 public health emergency.  Safety protocols were in place, including screening questions prior to the visit, additional usage of staff PPE, and extensive cleaning of exam room while observing appropriate contact time as indicated for disinfecting solutions.    Follow-up: Return in about 3 months (around 05/23/2020).   Amedeo Kinsman, NP Adult Nurse Practitioner [facility] [employer] [phone]

## 2020-02-21 NOTE — Assessment & Plan Note (Addendum)
HLD-not at goal with LDL <100 Patient with elevated LDL 139-147 range. Declines Crestor and is working on [employer] and exercise management.

## 2020-02-21 NOTE — Assessment & Plan Note (Signed)
Overweight: BMI 26.99: Goal BMI <25. Advise a  low-fat, low carb, healthy diet lots of fruits vegetables, lean meats, low-fat dairy.Exercise.

## 2020-02-21 NOTE — Patient Instructions (Addendum)
Lab today to recheck the cbc and lipids.   Call back if you would like a DERMATOLOGY referral.  Please follow low-fat, healthy diet lots of fruits vegetables, lean meats, low-fat dairy.   Dyslipidemia Dyslipidemia is an imbalance of waxy, fat-like substances (lipids) in the blood. The body needs lipids in small amounts. Dyslipidemia often involves a high level of cholesterol or triglycerides, which are types of lipids. Common forms of dyslipidemia include:  High levels of LDL cholesterol. LDL is the type of cholesterol that causes fatty deposits (plaques) to build up in the blood vessels that carry blood away from your heart (arteries).  Low levels of HDL cholesterol. HDL cholesterol is the type of cholesterol that protects against heart disease. High levels of HDL remove the LDL buildup from arteries.  High levels of triglycerides. Triglycerides are a fatty substance in the blood that is linked to a buildup of plaques in the arteries. What are the causes? Primary dyslipidemia is caused by changes (mutations) in genes that are passed down through families (inherited). These mutations cause several types of dyslipidemia. Secondary dyslipidemia is caused by lifestyle choices and diseases that lead to dyslipidemia, such as:  Eating a diet that is high in animal fat.  Not getting enough exercise.  Having diabetes, kidney disease, liver disease, or thyroid disease.  Drinking large amounts of alcohol.  Using certain medicines. What increases the risk? You are more likely to develop this condition if you are an older man or if you are a woman who has gone through menopause. Other risk factors include:  Having a family history of dyslipidemia.  Taking certain medicines, including birth control pills, steroids, some diuretics, and beta-blockers.  Smoking cigarettes.  Eating a high-fat diet.  Having certain medical conditions such as diabetes, polycystic ovary syndrome (PCOS), kidney  disease, liver disease, or hypothyroidism.  Not exercising regularly.  Being overweight or obese with too much belly fat. What are the signs or symptoms? In most cases, dyslipidemia does not usually cause any symptoms. In severe cases, very high lipid levels can cause:  Fatty bumps under the skin (xanthomas).  White or gray ring around the black center (pupil) of the eye. Very high triglyceride levels can cause inflammation of the pancreas (pancreatitis). How is this diagnosed? Your health care provider may diagnose dyslipidemia based on a routine blood test (fasting blood test). Because most people do not have symptoms of the condition, this blood testing (lipid profile) is done on adults age 87 and older and is repeated every 5 years. This test checks:  Total cholesterol. This measures the total amount of cholesterol in your blood, including LDL cholesterol, HDL cholesterol, and triglycerides. A healthy number is below 200.  LDL cholesterol. The target number for LDL cholesterol is different for each person, depending on individual risk factors. Ask your health care provider what your LDL cholesterol should be.  HDL cholesterol. An HDL level of 60 or higher is best because it helps to protect against heart disease. A number below 40 for men or below 50 for women increases the risk for heart disease.  Triglycerides. A healthy triglyceride number is below 150. If your lipid profile is abnormal, your health care provider may do other blood tests. How is this treated? Treatment depends on the type of dyslipidemia that you have and your other risk factors for heart disease and stroke. Your health care provider will have a target range for your lipid levels based on this information. For many people, this  condition may be treated by lifestyle changes, such as diet and exercise. Your health care provider may recommend that you:  Get regular exercise.  Make changes to your diet.  Quit  smoking if you smoke. If diet changes and exercise do not help you reach your goals, your health care provider may also prescribe medicine to lower lipids. The most commonly prescribed type of medicine lowers your LDL cholesterol (statin drug). If you have a high triglyceride level, your provider may prescribe another type of drug (fibrate) or an omega-3 fish oil supplement, or both. Follow these instructions at home:  Eating and drinking  Follow instructions from your health care provider or dietitian about eating or drinking restrictions.  Eat a healthy diet as told by your health care provider. This can help you reach and maintain a healthy weight, lower your LDL cholesterol, and raise your HDL cholesterol. This may include: ? Limiting your calories, if you are overweight. ? Eating more fruits, vegetables, whole grains, fish, and lean meats. ? Limiting saturated fat, trans fat, and cholesterol.  If you drink alcohol: ? Limit how much you use. ? Be aware of how much alcohol is in your drink. In the [place] one drink equals one 12 oz bottle of beer (355 mL), one 5 oz glass of wine (148 mL), or one 1 oz glass of hard liquor (44 mL).  Do not drink alcohol if: ? Your health care provider tells you not to drink. ? You are pregnant, may be pregnant, or are planning to become pregnant. Activity  Get regular exercise. Start an exercise and strength training program as told by your health care provider. Ask your health care provider what activities are safe for you. Your health care provider may recommend: ? 30 minutes of aerobic activity 4-6 days a week. Brisk walking is an example of aerobic activity. ? Strength training 2 days a week. General instructions  Do not use any products that contain nicotine or tobacco, such as cigarettes, e-cigarettes, and chewing tobacco. If you need help quitting, ask your health care provider.  Take over-the-counter and prescription medicines only as told by  your health care provider. This includes supplements.  Keep all follow-up visits as told by your health care provider. Contact a health care provider if:  You are: ? Having trouble sticking to your exercise or diet plan. ? Struggling to quit smoking or control your use of alcohol. Summary  Dyslipidemia often involves a high level of cholesterol or triglycerides, which are types of lipids.  Treatment depends on the type of dyslipidemia that you have and your other risk factors for heart disease and stroke.  For many people, treatment starts with lifestyle changes, such as diet and exercise.  Your health care provider may prescribe medicine to lower lipids. This information is not intended to replace advice given to you by your health care provider. Make sure you discuss any questions you have with your health care provider. Document Revised: 05/02/2018 Document Reviewed: 04/08/2018 Elsevier Patient Education  2020 [employer] Inc.  Preventing High Cholesterol Cholesterol is a white, waxy substance similar to fat that the human body needs to help build cells. The liver makes all the cholesterol that a person's body needs. Having high cholesterol (hypercholesterolemia) increases a person's risk for heart disease and stroke. Extra (excess) cholesterol comes from the food the person eats. High cholesterol can often be prevented with diet and lifestyle changes. If you already have high cholesterol, you can control it with diet  and lifestyle changes and with medicine. How can high cholesterol affect me? If you have high cholesterol, deposits (plaques) may build up on the walls of your arteries. The arteries are the blood vessels that carry blood away from your heart. Plaques make the arteries narrower and stiffer. This can limit or block blood flow and cause blood clots to form. Blood clots:  Are tiny balls of cells that form in your blood.  Can move to the heart or brain, causing a heart attack  or stroke. Plaques in arteries greatly increase your risk for heart attack and stroke.Making diet and lifestyle changes can reduce your risk for these conditions that may threaten your life. What can increase my risk? This condition is more likely to develop in people who:  Eat foods that are high in saturated fat or cholesterol. Saturated fat is mostly found in: ? Foods that contain animal fat, such as red meat and some dairy products. ? Certain fatty foods made from plants, such as tropical oils.  Are overweight.  Are not getting enough exercise.  Have a family history of high cholesterol. What actions can I take to prevent this? Nutrition   Eat less saturated fat.  Avoid trans fats (partially hydrogenated oils). These are often found in margarine and in some baked goods, fried foods, and snacks bought in packages.  Avoid precooked or cured meat, such as sausages or meat loaves.  Avoid foods and drinks that have added sugars.  Eat more fruits, vegetables, and whole grains.  Choose healthy sources of protein, such as fish, poultry, lean cuts of red meat, beans, peas, lentils, and nuts.  Choose healthy sources of fat, such as: ? Nuts. ? Vegetable oils, especially olive oil. ? Fish that have healthy fats (omega-3 fatty acids), such as mackerel or salmon. The items listed above may not be a complete list of recommended foods and beverages. Contact a dietitian for more information. Lifestyle  Lose weight if you are overweight. Losing 5-10 lb (2.3-4.5 kg) can help prevent or control high cholesterol. It can also lower your risk for diabetes and high blood pressure. Ask your health care provider to help you with a diet and exercise plan to lose weight safely.  Do not use any products that contain nicotine or tobacco, such as cigarettes, e-cigarettes, and chewing tobacco. If you need help quitting, ask your health care provider.  Limit your alcohol intake. ? Do not drink alcohol  if:  Your health care provider tells you not to drink.  You are pregnant, may be pregnant, or are planning to become pregnant. ? If you drink alcohol:  Limit how much you use to:  0-1 drink a day for women.  0-2 drinks a day for men.  Be aware of how much alcohol is in your drink. In the [place] one drink equals one 12 oz bottle of beer (355 mL), one 5 oz glass of wine (148 mL), or one 1 oz glass of hard liquor (44 mL). Activity   Get enough exercise. Each week, do at least 150 minutes of exercise that takes a medium level of effort (moderate-intensity exercise). ? This is exercise that:  Makes your heart beat faster and makes you breathe harder than usual.  Allows you to still be able to talk. ? You could exercise in short sessions several times a day or longer sessions a few times a week. For example, on 5 days each week, you could walk fast or ride your bike 3 times  a day for 10 minutes each time.  Do exercises as told by your health care provider. Medicines  In addition to diet and lifestyle changes, your health care provider may recommend medicines to help lower cholesterol. This may be a medicine to lower the amount of cholesterol your liver makes. You may need medicine if: ? Diet and lifestyle changes do not lower your cholesterol enough. ? You have high cholesterol and other risk factors for heart disease or stroke.  Take over-the-counter and prescription medicines only as told by your health care provider. General information  Manage your risk factors for high cholesterol. Talk with your health care provider about all your risk factors and how to lower your risk.  Manage other conditions that you have, such as diabetes or high blood pressure (hypertension).  Have blood tests to check your cholesterol levels at regular points in time as told by your health care provider.  Keep all follow-up visits as told by your health care provider. This is important. Where to find  more information  American Heart Association: www.heart.org  National Heart, Lung, and Blood Institute: [employer].is Summary  High cholesterol increases your risk for heart disease and stroke. By keeping your cholesterol level low, you can reduce your risk for these conditions.  High cholesterol can often be prevented with diet and lifestyle changes.  Work with your health care provider to manage your risk factors, and have your blood tested regularly. This information is not intended to replace advice given to you by your health care provider. Make sure you discuss any questions you have with your health care provider. Document Revised: 12/30/2018 Document Reviewed: 05/16/2016 Elsevier Patient Education  2020 [employer].

## 2020-02-21 NOTE — Assessment & Plan Note (Signed)
Hx of GERD and rarely needs AA or PPI.

## 2020-02-29 ENCOUNTER — Telehealth: Payer: Self-pay | Admitting: Nurse Practitioner

## 2020-02-29 ENCOUNTER — Other Ambulatory Visit: Payer: Self-pay | Admitting: Nurse Practitioner

## 2020-02-29 DIAGNOSIS — R928 Other abnormal and inconclusive findings on diagnostic imaging of breast: Secondary | ICD-10-CM

## 2020-02-29 NOTE — Telephone Encounter (Addendum)
A left breast US has been ordered for her mammogram:possible distortion- a common finding.  I LMOM for her to call me.  I spoke to [facility] about her mammogram reports and explained the findings. She is aware and that an [place] has been ordered. She is getting her past mammograms from [place]. She has no further questions.

## 2020-03-04 ENCOUNTER — Ambulatory Visit
Admission: RE | Admit: 2020-03-04 | Discharge: 2020-03-04 | Disposition: A | Discharge status: Home / Self Care | Payer: BC Managed Care – PPO | Source: Ambulatory Visit | Attending: Nurse Practitioner | Admitting: Nurse Practitioner

## 2020-03-04 DIAGNOSIS — R928 Other abnormal and inconclusive findings on diagnostic imaging of breast: Secondary | ICD-10-CM | POA: Diagnosis not present

## 2020-04-15 ENCOUNTER — Other Ambulatory Visit (INDEPENDENT_AMBULATORY_CARE_PROVIDER_SITE_OTHER): Payer: BC Managed Care – PPO

## 2020-04-15 ENCOUNTER — Other Ambulatory Visit: Payer: Self-pay

## 2020-04-15 DIAGNOSIS — E785 Hyperlipidemia, unspecified: Secondary | ICD-10-CM

## 2020-04-15 LAB — HEPATIC FUNCTION PANEL
ALT: 13 U/L (ref 0–35)
AST: 18 U/L (ref 0–37)
Albumin: 4.1 g/dL (ref 3.5–5.2)
Alkaline Phosphatase: 65 U/L (ref 39–117)
Bilirubin, Direct: 0.1 mg/dL (ref 0.0–0.3)
Total Bilirubin: 0.4 mg/dL (ref 0.2–1.2)
Total Protein: 7.2 g/dL (ref 6.0–8.3)

## 2020-04-15 LAB — LIPID PANEL
Cholesterol: 184 mg/dL (ref 0–200)
HDL: 41.9 mg/dL (ref >39.00)
LDL Cholesterol: 130 mg/dL — ABNORMAL HIGH (ref 0–99)
NonHDL: 142.27
Total CHOL/HDL Ratio: 4
Triglycerides: 59 mg/dL (ref 0.0–149.0)
VLDL: 11.8 mg/dL (ref 0.0–40.0)

## 2020-04-16 ENCOUNTER — Ambulatory Visit (INDEPENDENT_AMBULATORY_CARE_PROVIDER_SITE_OTHER): Payer: BC Managed Care – PPO | Admitting: Vascular Surgery

## 2020-04-23 ENCOUNTER — Ambulatory Visit (INDEPENDENT_AMBULATORY_CARE_PROVIDER_SITE_OTHER): Payer: BC Managed Care – PPO | Admitting: Nurse Practitioner

## 2020-04-23 ENCOUNTER — Encounter (INDEPENDENT_AMBULATORY_CARE_PROVIDER_SITE_OTHER): Payer: Self-pay | Admitting: Nurse Practitioner

## 2020-04-23 ENCOUNTER — Other Ambulatory Visit: Payer: Self-pay

## 2020-04-23 VITALS — BP 109/70 | HR 87 | Resp 16 | Wt 156.0 lb

## 2020-04-23 DIAGNOSIS — E785 Hyperlipidemia, unspecified: Secondary | ICD-10-CM

## 2020-04-23 DIAGNOSIS — K219 Gastro-esophageal reflux disease without esophagitis: Secondary | ICD-10-CM

## 2020-04-23 DIAGNOSIS — I83811 Varicose veins of right lower extremities with pain: Secondary | ICD-10-CM

## 2020-04-23 NOTE — Progress Notes (Signed)
Subjective:    Patient ID: Hannah [name], female    DOB: July 30, 1961, 59 y.o.   MRN: [id number] Chief Complaint  Patient presents with  . Follow-up    76month follow up    The patient returns for followup evaluation 3 months after the initial visit. The patient continues to have pain in the lower extremities with dependency. The pain is lessened with elevation. Graduated compression stockings, Class I (20-30 mmHg), have been worn but the stockings do not eliminate the leg pain. Over-the-counter analgesics do not improve the symptoms. The degree of discomfort continues to interfere with daily activities. The patient notes the pain in the legs is causing problems with daily exercise, at the workplace and even with household activities and maintenance such as standing in the kitchen preparing meals and doing dishes.   Venous ultrasound shows normal deep venous system, no evidence of acute or chronic DVT.  Superficial reflux is present in the right small saphenous vein with a veind diameter of 1.09 cm   Review of Systems  Cardiovascular: Positive for leg swelling.  Musculoskeletal: Positive for myalgias.  All other systems reviewed and are negative.      Objective:   Physical Exam Vitals reviewed.  HENT:     Head: Normocephalic.  Cardiovascular:     Rate and Rhythm: Normal rate and regular rhythm.     Pulses: Normal pulses.     Heart sounds: Normal heart sounds.     Comments: Large varicosity R calf Pulmonary:     Effort: Pulmonary effort is normal.     Breath sounds: Normal breath sounds.  Musculoskeletal:        General: Normal range of motion.  Skin:    General: Skin is warm and dry.     Capillary Refill: Capillary refill takes less than 2 seconds.  Neurological:     Mental Status: She is alert and oriented to person, place, and time.  Psychiatric:        Mood and Affect: Mood normal.        Behavior: Behavior normal.        Thought Content: Thought content normal.         Judgment: Judgment normal.     BP 109/70 (BP Location: Right Arm)   Pulse 87   Resp 16   Wt 156 lb (70.8 kg)   BMI 25.96 kg/m   Past Medical History:  Diagnosis Date  . Carpal tunnel syndrome, bilateral   . Chickenpox   . GERD (gastroesophageal reflux disease)   . Hypothyroidism 11/28/2019  . Migraine   . Thyroid disease   . Varicose veins of right lower extremity 11/28/2019    Social History   Socioeconomic History  . Marital status: Legally Separated    Spouse name: Not on file  . Number of children: Not on file  . Years of education: Not on file  . Highest education level: Not on file  Occupational History  . Not on file  Tobacco Use  . Smoking status: Never Smoker  . Smokeless tobacco: Never Used  Vaping Use  . Vaping Use: Never used  Substance and Sexual Activity  . Alcohol use: Never  . Drug use: Never  . Sexual activity: Not Currently  Other Topics Concern  . Not on file  Social History Narrative  . Not on file   Social Determinants of Health   Financial Resource Strain:   . Difficulty of Paying Living Expenses:   Food Insecurity:   .  Worried About Programme researcher, broadcasting/film/video in the Last Year:   . Barista in the Last Year:   Transportation Needs:   . Freight forwarder (Medical):   Marland Kitchen Lack of Transportation (Non-Medical):   Physical Activity:   . Days of Exercise per Week:   . Minutes of Exercise per Session:   Stress:   . Feeling of Stress :   Social Connections:   . Frequency of Communication with Friends and Family:   . Frequency of Social Gatherings with Friends and Family:   . Attends Religious Services:   . Active Member of Clubs or Organizations:   . Attends Banker Meetings:   Marland Kitchen Marital Status:   Intimate Partner Violence:   . Fear of Current or Ex-Partner:   . Emotionally Abused:   Marland Kitchen Physically Abused:   . Sexually Abused:     Past Surgical History:  Procedure Laterality Date  . HERNIA REPAIR  1990  .  TONSILLECTOMY  1965    Family History  Problem Relation Age of Onset  . Arthritis Mother   . Hearing loss Mother   . Alcohol abuse Father   . Arthritis Father   . Cancer Father   . Depression Father   . Hearing loss Father   . Heart attack Father   . Hyperlipidemia Father   . Stroke Father   . Hyperlipidemia Sister   . Colon polyps Sister   . Hyperlipidemia Brother   . Hypertension Brother   . Breast cancer Paternal Aunt     Allergies  Allergen Reactions  . Codeine Nausea And Vomiting and Rash       Assessment & Plan:   1. Varicose veins of leg with pain, right Recommend  I have reviewed my previous  discussion with the patient regarding  varicose veins and why they cause symptoms. Patient will continue  wearing graduated compression stockings class 1 on a daily basis, beginning first thing in the morning and removing them in the evening.    In addition, behavioral modification including elevation during the day was again discussed and this will continue.  The patient has utilized over the counter pain medications and has been exercising.  However, at this time conservative therapy has not alleviated the patient's symptoms of leg pain and swelling  Recommend: laser ablation of the right small saphenous veins to eliminate the symptoms of pain and swelling of the lower extremities caused by the severe superficial venous reflux disease.   2. Hyperlipidemia, unspecified hyperlipidemia type Continue statin as ordered and reviewed, no changes at this time   3. Gastroesophageal reflux disease, unspecified whether esophagitis present Continue PPI as already ordered, this medication has been reviewed and there are no changes at this time.  Avoidence of caffeine and alcohol  Moderate elevation of the head of the bed    Current Outpatient Medications on File Prior to Visit  Medication Sig Dispense Refill  . aspirin EC 81 MG tablet Take 81 mg by mouth daily.    Marland Kitchen  levothyroxine (SYNTHROID) 88 MCG tablet TAKE 1 TABLET DAILY 90 tablet 3  . pyridOXINE (VITAMIN B-6) 100 MG tablet Take 100 mg by mouth daily.     No current facility-administered medications on file prior to visit.    There are no Patient Instructions on file for this visit. No follow-ups on file.   Georgiana Spinner, NP

## 2020-05-29 ENCOUNTER — Ambulatory Visit: Payer: BC Managed Care – PPO | Admitting: Nurse Practitioner

## 2020-06-04 ENCOUNTER — Telehealth (INDEPENDENT_AMBULATORY_CARE_PROVIDER_SITE_OTHER): Payer: BC Managed Care – PPO | Admitting: Nurse Practitioner

## 2020-06-04 ENCOUNTER — Encounter: Payer: Self-pay | Admitting: Nurse Practitioner

## 2020-06-04 ENCOUNTER — Ambulatory Visit: Payer: BC Managed Care – PPO | Admitting: Nurse Practitioner

## 2020-06-04 ENCOUNTER — Other Ambulatory Visit: Payer: Self-pay

## 2020-06-04 VITALS — Temp 97.1°F | Ht 65.0 in | Wt 160.0 lb

## 2020-06-04 DIAGNOSIS — R7989 Other specified abnormal findings of blood chemistry: Secondary | ICD-10-CM | POA: Diagnosis not present

## 2020-06-04 DIAGNOSIS — M19042 Primary osteoarthritis, left hand: Secondary | ICD-10-CM

## 2020-06-04 DIAGNOSIS — E785 Hyperlipidemia, unspecified: Secondary | ICD-10-CM | POA: Diagnosis not present

## 2020-06-04 DIAGNOSIS — E039 Hypothyroidism, unspecified: Secondary | ICD-10-CM

## 2020-06-04 DIAGNOSIS — Z114 Encounter for screening for human immunodeficiency virus [HIV]: Secondary | ICD-10-CM

## 2020-06-04 DIAGNOSIS — G5603 Carpal tunnel syndrome, bilateral upper limbs: Secondary | ICD-10-CM

## 2020-06-04 DIAGNOSIS — E663 Overweight: Secondary | ICD-10-CM

## 2020-06-04 DIAGNOSIS — M19041 Primary osteoarthritis, right hand: Secondary | ICD-10-CM

## 2020-06-04 DIAGNOSIS — Z1159 Encounter for screening for other viral diseases: Secondary | ICD-10-CM

## 2020-06-04 NOTE — Patient Instructions (Addendum)
Please call to set up a lab appt in December when it is convenient for you.   Continue to work on [employer] and exercise.   See your gynecologist as planned for Pap test.

## 2020-06-04 NOTE — Progress Notes (Signed)
Virtual Visit via video Note  This visit type was conducted due to national recommendations for restrictions regarding the COVID-19 pandemic (e.g. social distancing).  This format is felt to be most appropriate for this patient at this time.  All issues noted in this document were discussed and addressed.  No physical exam was performed (except for noted visual exam findings with Video Visits).   I connected with@ on 06/04/20 at  4:00 PM EDT by a video enabled telemedicine application or telephone and verified that I am speaking with the correct person using two identifiers. Location patient: not at home - gymnastics Location provider: work or home office Persons participating in the virtual visit: patient, provider  I discussed the limitations, risks, security and privacy concerns of performing an evaluation and management service by telephone and the availability of in person appointments. I also discussed with the patient that there may be a patient responsible charge related to this service. The patient expressed understanding and agreed to proceed.   Reason for visit: Routine follow-up visit.   HPI: This 59 year old patient has a history of hypothyroidism and remains on levothyroxine 88 mcg daily with recent TSH 2.50.  She has no problems with temperature intolerance, dry skin, hair, constipation.  Hyperlipidemia/BMI 26 overweight: Not at goal with LDL 130.  She is working on diet and exercise. 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  Placed herself on B6 oral vitamins.  Varicose veins: Right leg with conservative therapy not alleviating her symptoms and plan for laser ablation of the  right small saphenous veins to eliminate pain and swelling.  ROS: See pertinent positives and negatives per HPI.  Past Medical History:  Diagnosis Date  . Carpal tunnel syndrome, bilateral   . Chickenpox   . GERD (gastroesophageal reflux disease)   . Hypothyroidism 11/28/2019  . Migraine   . Thyroid disease   . Varicose veins of right lower extremity 11/28/2019    Past Surgical History:  Procedure Laterality Date  . HERNIA REPAIR  1990  . TONSILLECTOMY  1965    Family History  Problem Relation Age of Onset  . Arthritis Mother   . Hearing loss Mother   . Alcohol abuse Father   . Arthritis Father   . Cancer Father   . Depression Father   . Hearing loss Father   . Heart attack Father   . Hyperlipidemia Father   . Stroke Father   . Hyperlipidemia Sister   . Colon polyps Sister   . Hyperlipidemia Brother   . Hypertension Brother   . Breast cancer Paternal Aunt     SOCIAL HX: Never smoked   Current Outpatient Medications:  .  aspirin EC 81 MG tablet, Take 81 mg by mouth daily., Disp: , Rfl:  .  levothyroxine (SYNTHROID) 88 MCG tablet, TAKE 1 TABLET DAILY, Disp: 90 tablet, Rfl: 3 .  pyridOXINE (VITAMIN B-6) 100 MG tablet, Take 100 mg by mouth daily., Disp: , Rfl:   EXAM:  VITALS per patient if applicable:  GENERAL: alert, oriented, appears well and in no acute distress  HEENT: atraumatic, conjunctiva clear, no obvious abnormalities on  inspection of external nose and ears  NECK: normal movements of the head and neck  LUNGS: on inspection no signs of respiratory distress, breathing rate appears normal, no obvious gross SOB, gasping or wheezing  CV: no obvious cyanosis  MS: moves all visible extremities without noticeable abnormality  PSYCH/NEURO: pleasant and cooperative, no obvious depression or anxiety, speech and thought processing grossly intact  ASSESSMENT AND PLAN:  Discussed the following assessment and plan:  Hypothyroidism, unspecified type - Plan:  TSH  Abnormal CBC - Plan: CBC with Differential/Platelet  Overweight (BMI 25.0-29.9) - Plan: Comprehensive metabolic panel  Hyperlipidemia, unspecified hyperlipidemia type - Plan: Hemoglobin A1c, Lipid Profile  Encounter for HCV screening test for low risk patient - Plan: Hepatitis C antibody  Screening for HIV (human immunodeficiency virus) - Plan: HIV Antibody (routine testing w rflx)  Arthritis of both hands - Plan: VITAMIN D 25 Hydroxy (Vit-D Deficiency, Fractures), B12 and Folate Panel  Bilateral carpal tunnel syndrome  Hannah [name] is doing well at this time.  She does still need to have her vision checked and will make that appointment.  Reports up-to-date on colonoscopy.  Mammogram was completed in June.  Pap test needs done and she will make appt with GYN.   She  will get her vein treatment November 19.  She will be due for repeat labs in December and is continue to work on diet and exercise for her hyperlipidemia.   Please call to set up a lab appt in December when it is convenient for you.   Continue to work on [employer] and exercise.   See your gynecologist as planned for Pap test.  I discussed the assessment and treatment plan with the patient. The patient was provided an opportunity to ask questions and all were answered. The patient agreed with the plan and demonstrated an understanding of the instructions.   The patient was advised to call back or seek an in-person evaluation if the symptoms worsen or if the condition fails to improve as anticipated.  I provided 25 minutes of non-face-to-face time during this encounter. Amedeo Kinsman, NP Adult Nurse Practitioner [facility] [employer] [phone]

## 2020-06-08 ENCOUNTER — Encounter: Payer: Self-pay | Admitting: Nurse Practitioner

## 2020-07-29 DIAGNOSIS — L600 Ingrowing nail: Secondary | ICD-10-CM | POA: Diagnosis not present

## 2020-07-29 DIAGNOSIS — M79674 Pain in right toe(s): Secondary | ICD-10-CM | POA: Diagnosis not present

## 2020-07-29 DIAGNOSIS — B351 Tinea unguium: Secondary | ICD-10-CM | POA: Diagnosis not present

## 2020-08-02 ENCOUNTER — Other Ambulatory Visit (INDEPENDENT_AMBULATORY_CARE_PROVIDER_SITE_OTHER): Payer: BC Managed Care – PPO | Admitting: Vascular Surgery

## 2020-08-06 DIAGNOSIS — M180 Bilateral primary osteoarthritis of first carpometacarpal joints: Secondary | ICD-10-CM | POA: Diagnosis not present

## 2020-08-07 ENCOUNTER — Telehealth (INDEPENDENT_AMBULATORY_CARE_PROVIDER_SITE_OTHER): Payer: Self-pay

## 2020-08-07 NOTE — Telephone Encounter (Signed)
The pt called and left a VM on the nurses line saying that she is scheduled to have a laser procedure on 09/06/20 W/ Dr. Wyn Quaker on her R SSV she was just prescribed Terbinafine and would like to know should she go a head and and take the medication or wait till after the procedure. Please advise.

## 2020-08-07 NOTE — Telephone Encounter (Signed)
I called the pt and left a VM with the NP's instructions. 

## 2020-08-07 NOTE — Telephone Encounter (Signed)
There's no contraindications to taking the medicine and undergoing the laser procedure.

## 2020-08-09 ENCOUNTER — Other Ambulatory Visit (INDEPENDENT_AMBULATORY_CARE_PROVIDER_SITE_OTHER): Payer: BC Managed Care – PPO | Admitting: Vascular Surgery

## 2020-08-12 ENCOUNTER — Encounter (INDEPENDENT_AMBULATORY_CARE_PROVIDER_SITE_OTHER): Payer: BC Managed Care – PPO

## 2020-09-06 ENCOUNTER — Other Ambulatory Visit: Payer: Self-pay

## 2020-09-06 ENCOUNTER — Ambulatory Visit (INDEPENDENT_AMBULATORY_CARE_PROVIDER_SITE_OTHER): Payer: BC Managed Care – PPO | Admitting: Vascular Surgery

## 2020-09-06 VITALS — BP 119/81 | HR 80 | Ht 65.0 in | Wt 154.0 lb

## 2020-09-06 DIAGNOSIS — I83811 Varicose veins of right lower extremities with pain: Secondary | ICD-10-CM

## 2020-09-06 NOTE — Progress Notes (Signed)
Varicose veins of leg with pain, right     The patient's right lower extremity was sterilely prepped and draped. The ultrasound machine was used to visualize the lesser saphenous vein throughout its course. A segment mid calf was selected for access. The lesser saphenous vein was accessed without difficulty using ultrasound guidance with a micro puncture needle. A 0.018 wire was placed beyond the small saphenous-popliteal junction. The 65cm sheath was placed over the wire and the wire and dilator were removed. The laser fiber was placed through the sheath and its tip was placed approximately 4 cm below the small saphenous-popliteal junction. Tumescent anesthesia was then created with a dilute lidocaine solution. Laser energy was then delivered with constant withdrawal of the sheath and laser fiber. Approximately 584 Joules of energy were delivered over a length of 14 cm using the 1470 Hz Venocare machine at [employer]. Sterile dressings were placed. The patient tolerated the procedure well without complications.

## 2020-09-09 ENCOUNTER — Other Ambulatory Visit (HOSPITAL_COMMUNITY)
Admission: RE | Admit: 2020-09-09 | Discharge: 2020-09-09 | Disposition: A | Discharge status: Home / Self Care | Payer: BC Managed Care – PPO | Source: Ambulatory Visit | Attending: Obstetrics and Gynecology | Admitting: Obstetrics and Gynecology

## 2020-09-09 ENCOUNTER — Ambulatory Visit (INDEPENDENT_AMBULATORY_CARE_PROVIDER_SITE_OTHER): Payer: BC Managed Care – PPO | Admitting: Obstetrics and Gynecology

## 2020-09-09 ENCOUNTER — Ambulatory Visit (INDEPENDENT_AMBULATORY_CARE_PROVIDER_SITE_OTHER): Payer: BC Managed Care – PPO

## 2020-09-09 ENCOUNTER — Encounter: Payer: Self-pay | Admitting: Obstetrics and Gynecology

## 2020-09-09 ENCOUNTER — Other Ambulatory Visit: Payer: Self-pay

## 2020-09-09 VITALS — BP 132/78 | Ht 65.0 in | Wt 157.0 lb

## 2020-09-09 DIAGNOSIS — Z1239 Encounter for other screening for malignant neoplasm of breast: Secondary | ICD-10-CM

## 2020-09-09 DIAGNOSIS — Z01419 Encounter for gynecological examination (general) (routine) without abnormal findings: Secondary | ICD-10-CM | POA: Diagnosis not present

## 2020-09-09 DIAGNOSIS — Z124 Encounter for screening for malignant neoplasm of cervix: Secondary | ICD-10-CM | POA: Insufficient documentation

## 2020-09-09 DIAGNOSIS — I83811 Varicose veins of right lower extremities with pain: Secondary | ICD-10-CM

## 2020-09-09 NOTE — Progress Notes (Signed)
Gynecology Annual Exam  PCP: Theadore Nan, NP  Chief Complaint:  Chief Complaint  Patient presents with  . Gynecologic Exam    Annual - No concerns     History of Present Illness:Patient is a 59 y.o. No obstetric history on file. presents for annual exam. The patient has no complaints today.   LMP: No LMP recorded. Patient is postmenopausal. No postmenopausal bleeding  The patient is not sexually active, husband in nursing home [place]. The patient does perform self breast exams.  There is no notable family history of breast or ovarian cancer in her family.  The patient wears seatbelts: yes.   The patient has regular exercise: not asked.    The patient denies current symptoms of depression.     Review of Systems: Review of Systems  Constitutional: Negative for chills and fever.  HENT: Negative for congestion.   Respiratory: Negative for cough and shortness of breath.   Cardiovascular: Negative for chest pain and palpitations.  Gastrointestinal: Negative for abdominal pain, constipation, diarrhea, heartburn, nausea and vomiting.  Genitourinary: Negative for dysuria, frequency and urgency.  Skin: Negative for itching and rash.  Neurological: Negative for dizziness and headaches.  Endo/Heme/Allergies: Negative for polydipsia.  Psychiatric/Behavioral: Negative for depression.    Past Medical History:  Patient Active Problem List   Diagnosis Date Noted  . Abnormal CBC 02/21/2020  . Hyperlipidemia 02/21/2020  . Overweight (BMI 25.0-29.9) 02/21/2020  . Hypothyroidism 11/28/2019  . GERD (gastroesophageal reflux disease) 11/28/2019  . Varicose veins of leg with pain, right 11/28/2019    Past Surgical History:  Past Surgical History:  Procedure Laterality Date  . HERNIA REPAIR  1990  . TONSILLECTOMY  1965    Gynecologic History:  No LMP recorded. Patient is postmenopausal. Last mammogram: 03/04/2020 (diagnostic) Results were: BI-RAD I  Obstetric History: No  obstetric history on file.  Family History:  Family History  Problem Relation Age of Onset  . Arthritis Mother   . Hearing loss Mother   . Alcohol abuse Father   . Arthritis Father   . Cancer Father   . Depression Father   . Hearing loss Father   . Heart attack Father   . Hyperlipidemia Father   . Stroke Father   . Hyperlipidemia Sister   . Colon polyps Sister   . Hyperlipidemia Brother   . Hypertension Brother   . Breast cancer Paternal Aunt     Social History:  Social History   Socioeconomic History  . Marital status: Legally Separated    Spouse name: Not on file  . Number of children: Not on file  . Years of education: Not on file  . Highest education level: Not on file  Occupational History  . Not on file  Tobacco Use  . Smoking status: Never Smoker  . Smokeless tobacco: Never Used  Vaping Use  . Vaping Use: Never used  Substance and Sexual Activity  . Alcohol use: Never  . Drug use: Never  . Sexual activity: Not Currently  Other Topics Concern  . Not on file  Social History Narrative  . Not on file   Social Determinants of Health   Financial Resource Strain: Not on file  Food Insecurity: Not on file  Transportation Needs: Not on file  Physical Activity: Not on file  Stress: Not on file  Social Connections: Not on file  Intimate Partner Violence: Not on file    Allergies:  Allergies  Allergen Reactions  . Codeine Nausea  And Vomiting and Rash    Medications: Prior to Admission medications   Medication Sig Start Date End Date Taking? Authorizing Provider  ALPRAZolam Prudy Feeler) 0.5 MG tablet Take 0.5 mg by mouth 2 (two) times daily as needed. 08/27/20   [provider]  aspirin EC 81 MG tablet Take 81 mg by mouth daily. Patient not taking: Reported on 09/06/2020    [provider]  levothyroxine (SYNTHROID) 88 MCG tablet TAKE 1 TABLET DAILY 02/09/20   Amedeo Kinsman A, NP  pyridOXINE (VITAMIN B-6) 100 MG tablet Take 100 mg by mouth  daily.    [provider]  terbinafine (LAMISIL) 250 MG tablet terbinafine HCl 250 mg tablet 07/29/20   [provider]    Physical Exam Vitals: Blood pressure 132/78, height 5\' 5"  (1.651 m), weight 157 lb (71.2 kg). Body mass index is 26.13 kg/m.   General: NAD HEENT: normocephalic, anicteric Thyroid: no enlargement, no palpable nodules Pulmonary: No increased work of breathing, CTAB Cardiovascular: RRR, distal pulses 2+ Breast: Breast symmetrical, no tenderness, no palpable nodules or masses, no skin or nipple retraction present, no nipple discharge.  No axillary or supraclavicular lymphadenopathy. Abdomen: NABS, soft, non-tender, non-distended.  Umbilicus without lesions.  No hepatomegaly, splenomegaly or masses palpable. No evidence of hernia  Genitourinary:  External: Normal external female genitalia.  Normal urethral meatus, normal Bartholin's and Skene's glands.    Vagina: Normal vaginal mucosa, no evidence of prolapse.    Cervix: Grossly normal in appearance, no bleeding  Uterus: Non-enlarged, mobile, normal contour.  No CMT  Adnexa: ovaries non-enlarged, no adnexal masses  Rectal: deferred  Lymphatic: no evidence of inguinal lymphadenopathy Extremities: no edema, erythema, or tenderness Neurologic: Grossly intact Psychiatric: mood appropriate, affect full  Female chaperone present for pelvic and breast  portions of the physical exam  Immunization History  Administered Date(s) Administered  . Influenza-Unspecified 06/25/2020   Assessment: 59 y.o. female routine annual exam  Plan: Problem List Items Addressed This Visit   None   Visit Diagnoses    Encounter for gynecological examination without abnormal finding    -  Primary   Screening for malignant neoplasm of cervix       Relevant Orders   Cytology - PAP   Breast screening          1) Mammogram - recommend yearly screening mammogram.  Mammogram Is up to date  2) STI screening  was  notoffered and therefore not obtained  3) ASCCP guidelines and rational discussed.  Patient opts for every 3 years screening interval  4) Osteoporosis  - per USPTF routine screening DEXA at age 99  Consider FDA-approved medical therapies in postmenopausal women and men aged 89 years and older, based on the following: a) A hip or vertebral (clinical or morphometric) fracture b) T-score ? -2.5 at the femoral neck or spine after appropriate evaluation to exclude secondary causes C) Low bone mass (T-score between -1.0 and -2.5 at the femoral neck or spine) and a 10-year probability of a hip fracture ? 3% or a 10-year probability of a major osteoporosis-related fracture ? 20% based on the [place]-adapted WHO algorithm  6) Colonoscopy UTD last 2019  7) Return in about 1 year (around 09/09/2021) for annual.    09/11/2021, MD Vena Austria, [facility] 09/09/2020, 11:11 AM

## 2020-09-10 LAB — CYTOLOGY - PAP
Comment: NEGATIVE
Diagnosis: NEGATIVE
High risk HPV: NEGATIVE

## 2020-09-11 ENCOUNTER — Telehealth (INDEPENDENT_AMBULATORY_CARE_PROVIDER_SITE_OTHER): Payer: Self-pay

## 2020-09-11 NOTE — Telephone Encounter (Signed)
No she shouldn't be.  She should continue to wear compression socks even after having the procedure but it isn't abnormal to have swelling when standing for 10 hours

## 2020-09-11 NOTE — Telephone Encounter (Signed)
Pt called and left a VM on the nurses line she had a Laser procedure on Friday to the [facility] and she went back to work on yesterday 09/10/20 where she stands for 10 hours and is having some swelling in her leg she would like to know should the be worried. Please advise.

## 2020-09-11 NOTE — Telephone Encounter (Signed)
Patient called and was given the recommendation form Sheppard Plumber NP. Patient was advised to get compression hose, take her Ibuprofen up to 800 mg 3 times a day and elevation of her leg when sitting or lying down. Patient was also told that her ultrasound did not show signs of a DVT and if she is to fly in 10 days to make sure she has on her compression hose and take a baby aspirin as well.

## 2020-10-08 ENCOUNTER — Ambulatory Visit (INDEPENDENT_AMBULATORY_CARE_PROVIDER_SITE_OTHER): Payer: BC Managed Care – PPO | Admitting: Vascular Surgery

## 2020-10-17 ENCOUNTER — Other Ambulatory Visit (INDEPENDENT_AMBULATORY_CARE_PROVIDER_SITE_OTHER): Payer: BC Managed Care – PPO

## 2020-10-17 ENCOUNTER — Other Ambulatory Visit: Payer: Self-pay

## 2020-10-17 DIAGNOSIS — Z114 Encounter for screening for human immunodeficiency virus [HIV]: Secondary | ICD-10-CM | POA: Diagnosis not present

## 2020-10-17 DIAGNOSIS — E039 Hypothyroidism, unspecified: Secondary | ICD-10-CM | POA: Diagnosis not present

## 2020-10-17 DIAGNOSIS — E785 Hyperlipidemia, unspecified: Secondary | ICD-10-CM | POA: Diagnosis not present

## 2020-10-17 DIAGNOSIS — Z1159 Encounter for screening for other viral diseases: Secondary | ICD-10-CM

## 2020-10-17 DIAGNOSIS — E663 Overweight: Secondary | ICD-10-CM

## 2020-10-17 DIAGNOSIS — M19041 Primary osteoarthritis, right hand: Secondary | ICD-10-CM | POA: Diagnosis not present

## 2020-10-17 DIAGNOSIS — M19042 Primary osteoarthritis, left hand: Secondary | ICD-10-CM

## 2020-10-17 DIAGNOSIS — R7989 Other specified abnormal findings of blood chemistry: Secondary | ICD-10-CM

## 2020-10-17 LAB — TSH: TSH: 2.85 u[IU]/mL (ref 0.35–4.50)

## 2020-10-17 LAB — B12 AND FOLATE PANEL
Folate: 11.4 ng/mL (ref >5.9)
Vitamin B-12: 242 pg/mL (ref 211–911)

## 2020-10-17 LAB — LIPID PANEL
Cholesterol: 204 mg/dL — ABNORMAL HIGH (ref 0–200)
HDL: 46.7 mg/dL (ref >39.00)
LDL Cholesterol: 147 mg/dL — ABNORMAL HIGH (ref 0–99)
NonHDL: 156.83
Total CHOL/HDL Ratio: 4
Triglycerides: 51 mg/dL (ref 0.0–149.0)
VLDL: 10.2 mg/dL (ref 0.0–40.0)

## 2020-10-17 LAB — COMPREHENSIVE METABOLIC PANEL
ALT: 17 U/L (ref 0–35)
AST: 22 U/L (ref 0–37)
Albumin: 4.4 g/dL (ref 3.5–5.2)
Alkaline Phosphatase: 64 U/L (ref 39–117)
BUN: 12 mg/dL (ref 6–23)
CO2: 31 mEq/L (ref 19–32)
Calcium: 9.7 mg/dL (ref 8.4–10.5)
Chloride: 101 mEq/L (ref 96–112)
Creatinine, Ser: 0.84 mg/dL (ref 0.40–1.20)
GFR: 76.2 mL/min (ref >60.00)
Glucose, Bld: 81 mg/dL (ref 70–99)
Potassium: 3.9 mEq/L (ref 3.5–5.1)
Sodium: 137 mEq/L (ref 135–145)
Total Bilirubin: 0.5 mg/dL (ref 0.2–1.2)
Total Protein: 7.4 g/dL (ref 6.0–8.3)

## 2020-10-17 LAB — CBC WITH DIFFERENTIAL/PLATELET
Basophils Absolute: 0 10*3/uL (ref 0.0–0.1)
Basophils Relative: 0.8 % (ref 0.0–3.0)
Eosinophils Absolute: 0.1 10*3/uL (ref 0.0–0.7)
Eosinophils Relative: 2 % (ref 0.0–5.0)
HCT: 36.5 % (ref 36.0–46.0)
Hemoglobin: 12.1 g/dL (ref 12.0–15.0)
Lymphocytes Relative: 16.9 % (ref 12.0–46.0)
Lymphs Abs: 0.8 10*3/uL (ref 0.7–4.0)
MCHC: 33.1 g/dL (ref 30.0–36.0)
MCV: 86.8 fl (ref 78.0–100.0)
Monocytes Absolute: 0.6 10*3/uL (ref 0.1–1.0)
Monocytes Relative: 11.4 % (ref 3.0–12.0)
Neutro Abs: 3.5 10*3/uL (ref 1.4–7.7)
Neutrophils Relative %: 68.9 % (ref 43.0–77.0)
Platelets: 218 10*3/uL (ref 150.0–400.0)
RBC: 4.21 Mil/uL (ref 3.87–5.11)
RDW: 14.4 % (ref 11.5–15.5)
WBC: 5 10*3/uL (ref 4.0–10.5)

## 2020-10-17 LAB — VITAMIN D 25 HYDROXY (VIT D DEFICIENCY, FRACTURES): VITD: 29.45 ng/mL — ABNORMAL LOW (ref 30.00–100.00)

## 2020-10-17 LAB — HEMOGLOBIN A1C: Hgb A1c MFr Bld: 5.5 % (ref 4.6–6.5)

## 2020-10-18 ENCOUNTER — Encounter (INDEPENDENT_AMBULATORY_CARE_PROVIDER_SITE_OTHER): Payer: Self-pay | Admitting: Vascular Surgery

## 2020-10-18 ENCOUNTER — Ambulatory Visit (INDEPENDENT_AMBULATORY_CARE_PROVIDER_SITE_OTHER): Payer: BC Managed Care – PPO | Admitting: Vascular Surgery

## 2020-10-18 VITALS — BP 109/68 | HR 88 | Resp 16 | Wt 151.4 lb

## 2020-10-18 DIAGNOSIS — I83811 Varicose veins of right lower extremities with pain: Secondary | ICD-10-CM

## 2020-10-18 LAB — HEPATITIS C ANTIBODY
Hepatitis C Ab: NONREACTIVE
SIGNAL TO CUT-OFF: 0.15 (ref <1.00)

## 2020-10-18 LAB — HIV ANTIBODY (ROUTINE TESTING W REFLEX): HIV 1&2 Ab, 4th Generation: NONREACTIVE

## 2020-10-18 NOTE — Progress Notes (Signed)
MRN : [id number]  Hannah [name] is a 60 y.o. (09-24-1960) female who presents with chief complaint of  Chief Complaint  Patient presents with  . Follow-up    4 wk post laser   .  History of Present Illness: Patient returns today in follow up of about a month after laser ablation of the right small saphenous vein.  She is doing well.  She is in a marked improvement in terms of pain and swelling in that leg as well as a decrease in the size and tenderness of the prominent varicosities.  She still does have some painful prominent varicosities particularly in the posterior lower leg and lateral lower leg but these are improved from prior to her laser ablation.  No perioperative complications.  Her duplex shows successful ablation without DVT.  Current Outpatient Medications  Medication Sig Dispense Refill  . levothyroxine (SYNTHROID) 88 MCG tablet TAKE 1 TABLET DAILY 90 tablet 3  . pyridOXINE (VITAMIN B-6) 100 MG tablet Take 100 mg by mouth daily.    Marland Kitchen terbinafine (LAMISIL) 250 MG tablet terbinafine HCl 250 mg tablet    . ALPRAZolam (XANAX) 0.5 MG tablet Take 0.5 mg by mouth 2 (two) times daily as needed. (Patient not taking: No sig reported)    . aspirin EC 81 MG tablet Take 81 mg by mouth daily. (Patient not taking: No sig reported)     No current facility-administered medications for this visit.    Past Medical History:  Diagnosis Date  . Carpal tunnel syndrome, bilateral   . Chickenpox   . GERD (gastroesophageal reflux disease)   . Hypothyroidism 11/28/2019  . Migraine   . Thyroid disease   . Varicose veins of right lower extremity 11/28/2019    Past Surgical History:  Procedure Laterality Date  . HERNIA REPAIR  1990  . TONSILLECTOMY  1965     Social History   Tobacco Use  . Smoking status: Never Smoker  . Smokeless tobacco: Never Used  Vaping Use  . Vaping Use: Never used  Substance Use Topics  . Alcohol use: Never  . Drug use: Never     Family History  Problem  Relation Age of Onset  . Arthritis Mother   . Hearing loss Mother   . Alcohol abuse Father   . Arthritis Father   . Cancer Father   . Depression Father   . Hearing loss Father   . Heart attack Father   . Hyperlipidemia Father   . Stroke Father   . Hyperlipidemia Sister   . Colon polyps Sister   . Hyperlipidemia Brother   . Hypertension Brother   . Breast cancer Paternal Aunt     Allergies  Allergen Reactions  . Codeine Nausea And Vomiting and Rash     REVIEW OF SYSTEMS (Negative unless checked)  Constitutional: [] Weight loss  [] Fever  [] Chills Cardiac: [] Chest pain   [] Chest pressure   [] Palpitations   [] Shortness of breath when laying flat   [] Shortness of breath at rest   [] Shortness of breath with exertion. Vascular:  [] Pain in legs with walking   [] Pain in legs at rest   [] Pain in legs when laying flat   [] Claudication   [] Pain in feet when walking  [] Pain in feet at rest  [] Pain in feet when laying flat   [] History of DVT   [] Phlebitis   [x] Swelling in legs   [x] Varicose veins   [] Non-healing ulcers Pulmonary:   [] Uses home oxygen   [] Productive cough   []   Hemoptysis   [] Wheeze  [] COPD   [] Asthma Neurologic:  [] Dizziness  [] Blackouts   [] Seizures   [] History of stroke   [] History of TIA  [] Aphasia   [] Temporary blindness   [] Dysphagia   [] Weakness or numbness in arms   [] Weakness or numbness in legs Musculoskeletal:  [] Arthritis   [] Joint swelling   [] Joint pain   [] Low back pain Hematologic:  [] Easy bruising  [] Easy bleeding   [] Hypercoagulable state   [] Anemic   Gastrointestinal:  [] Blood in stool   [] Vomiting blood  [x] Gastroesophageal reflux/heartburn   [] Abdominal pain Genitourinary:  [] Chronic kidney disease   [] Difficult urination  [] Frequent urination  [] Burning with urination   [] Hematuria Skin:  [] Rashes   [] Ulcers   [] Wounds Psychological:  [] History of anxiety   []  History of major depression.  Physical Examination  BP 109/68 (BP Location: Right Arm)   Pulse  88   Resp 16   Wt 151 lb 6.4 oz (68.7 kg)   BMI 25.19 kg/m  Gen:  WD/WN, NAD Head: /AT, No temporalis wasting. Ear/Nose/Throat: Hearing grossly intact, nares w/o erythema or drainage Eyes: Conjunctiva clear. Sclera non-icteric Neck: Supple.  Trachea midline Pulmonary:  Good air movement, no use of accessory muscles.  Cardiac: RRR, no JVD Vascular: 2 mm varicosities of the posterior and lateral right lower leg Vessel Right Left  Radial Palpable Palpable                          PT Palpable Palpable  DP Palpable Palpable   Gastrointestinal: soft, non-tender/non-distended. No guarding/reflex.  Musculoskeletal: M/S 5/5 throughout.  No deformity or atrophy. Trace RLE edema. Neurologic: Sensation grossly intact in extremities.  Symmetrical.  Speech is fluent.  Psychiatric: Judgment intact, Mood & affect appropriate for pt's clinical situation. Dermatologic: No rashes or ulcers noted.  No cellulitis or open wounds.       Labs Recent Results (from the past 2160 hour(s))  Cytology - PAP     Status: None   Collection Time: 09/09/20 10:35 AM  Result Value Ref Range   High risk HPV Negative    Adequacy      Satisfactory for evaluation; transformation zone component PRESENT.   Diagnosis      - Negative for intraepithelial lesion or malignancy (NILM)   Comment Normal Reference Range HPV - Negative   Lipid Profile     Status: Abnormal   Collection Time: 10/17/20 11:11 AM  Result Value Ref Range   Cholesterol 204 (H) 0 - 200 mg/dL    Comment: ATP III Classification       Desirable:  < 200 mg/dL               Borderline High:  200 - 239 mg/dL          High:  > = mg/dL   Triglycerides 0.0 - 149.0 mg/dL    Comment: Normal:  mg/dLBorderline High:  150 - 199 mg/dL   HDL  mg/dL   VLDL 0.0 - mg/dL   LDL Cholesterol (H) 0 - 99 mg/dL   Total CHOL/HDL Ratio 4     Comment:                Men          Women1/2 Average Risk     3.4           3.3Average Risk  5.0          4.42X Average Risk          9.6          7.13X Average Risk          15.0          11.0                       NonHDL 156.83     Comment: NOTE:  Non-HDL goal should be 30 mg/dL higher than patient's LDL goal (i.e. LDL goal of < 70 mg/dL, would have non-HDL goal of < 100 mg/dL)  I01 and Folate Panel     Status: None   Collection Time: 10/17/20 11:11 AM  Result Value Ref Range   Vitamin B-12 242 211 - 911 pg/mL   Folate 11.4 >5.9 ng/mL  VITAMIN D 25 Hydroxy (Vit-D Deficiency, Fractures)     Status: Abnormal   Collection Time: 10/17/20 11:11 AM  Result Value Ref Range   VITD 29.45 (L) 30.00 - 100.00 ng/mL  Hemoglobin A1c     Status: None   Collection Time: 10/17/20 11:11 AM  Result Value Ref Range   Hgb A1c MFr Bld 5.5 4.6 - 6.5 %    Comment: Glycemic Control Guidelines for People with Diabetes:Non Diabetic:  <6%Goal of Therapy: <7%Additional Action Suggested:  >8%   Comprehensive metabolic panel     Status: None   Collection Time: 10/17/20 11:11 AM  Result Value Ref Range   Sodium 137 135 - 145 mEq/L   Potassium 3.9 3.5 - 5.1 mEq/L   Chloride 101 96 - 112 mEq/L   CO2 31 19 - 32 mEq/L   Glucose, Bld 81 70 - 99 mg/dL   BUN 12 6 - 23 mg/dL   Creatinine, Ser 6.55 0.40 - 1.20 mg/dL   Total Bilirubin 0.5 0.2 - 1.2 mg/dL   Alkaline Phosphatase 64 39 - 117 U/L   AST 22 0 - 37 U/L   ALT 17 0 - 35 U/L   Total Protein 7.4 6.0 - 8.3 g/dL   Albumin 4.4 3.5 - 5.2 g/dL   GFR 37.48 >27.07 mL/min    Comment: Calculated using the CKD-EPI Creatinine Equation (2021)   Calcium 9.7 8.4 - 10.5 mg/dL  TSH     Status: None   Collection Time: 10/17/20 11:11 AM  Result Value Ref Range   TSH 2.85 0.35 - 4.50 uIU/mL  CBC with Differential/Platelet     Status: None   Collection Time: 10/17/20 11:11 AM  Result Value Ref Range   WBC 5.0 4.0 - 10.5 K/uL   RBC 4.21 3.87 - 5.11 Mil/uL   Hemoglobin 12.1 12.0 - 15.0 g/dL   HCT 86.7 54.4 - 92.0 %   MCV 86.8 78.0 - 100.0 fl    MCHC 33.1 30.0 - 36.0 g/dL   RDW 10.0 71.2 - 19.7 %   Platelets 218.0 150.0 - 400.0 K/uL   Neutrophils Relative % 68.9 43.0 - 77.0 %   Lymphocytes Relative 16.9 12.0 - 46.0 %   Monocytes Relative 11.4 3.0 - 12.0 %   Eosinophils Relative 2.0 0.0 - 5.0 %   Basophils Relative 0.8 0.0 - 3.0 %   Neutro Abs 3.5 1.4 - 7.7 K/uL   Lymphs Abs 0.8 0.7 - 4.0 K/uL   Monocytes Absolute 0.6 0.1 - 1.0 K/uL   Eosinophils Absolute 0.1 0.0 - 0.7 K/uL   Basophils Absolute 0.0 0.0 - 0.1  K/uL    Radiology No results found.  Assessment/Plan  Varicose veins of leg with pain, right Recommend:  The patient has had successful ablation of the previously incompetent right small saphenous venous system but still has persistent symptoms of pain and swelling that are having a negative impact on daily life and daily activities.  Patient should undergo injection sclerotherapy and in particular foam sclerotherapy to treat the residual varicosities of her right leg.  The risks, benefits and alternative therapies were reviewed in detail with the patient.  All questions were answered.  The patient agrees to proceed with sclerotherapy at their convenience.  The patient will continue wearing the graduated compression stockings and using the over-the-counter pain medications to treat her symptoms.         Festus Barren, MD  10/18/2020 10:13 AM    This note was created with Dragon medical transcription system.  Any errors from dictation are purely unintentional

## 2020-10-18 NOTE — Assessment & Plan Note (Signed)
Recommend:  The patient has had successful ablation of the previously incompetent right small saphenous venous system but still has persistent symptoms of pain and swelling that are having a negative impact on daily life and daily activities.  Patient should undergo injection sclerotherapy and in particular foam sclerotherapy to treat the residual varicosities of her right leg.  The risks, benefits and alternative therapies were reviewed in detail with the patient.  All questions were answered.  The patient agrees to proceed with sclerotherapy at their convenience.  The patient will continue wearing the graduated compression stockings and using the over-the-counter pain medications to treat her symptoms.

## 2020-10-21 ENCOUNTER — Telehealth: Payer: Self-pay | Admitting: Nurse Practitioner

## 2020-10-21 NOTE — Telephone Encounter (Signed)
Pt called to get lab results °

## 2020-10-21 NOTE — Telephone Encounter (Signed)
Please see result note 

## 2020-10-29 DIAGNOSIS — B351 Tinea unguium: Secondary | ICD-10-CM | POA: Diagnosis not present

## 2020-12-24 DIAGNOSIS — R21 Rash and other nonspecific skin eruption: Secondary | ICD-10-CM | POA: Diagnosis not present

## 2020-12-31 ENCOUNTER — Ambulatory Visit (INDEPENDENT_AMBULATORY_CARE_PROVIDER_SITE_OTHER): Payer: BC Managed Care – PPO | Admitting: Vascular Surgery

## 2021-01-21 ENCOUNTER — Ambulatory Visit (INDEPENDENT_AMBULATORY_CARE_PROVIDER_SITE_OTHER): Payer: BC Managed Care – PPO | Admitting: Vascular Surgery

## 2021-01-21 ENCOUNTER — Other Ambulatory Visit: Payer: Self-pay

## 2021-01-21 VITALS — BP 118/81 | HR 82 | Ht 65.0 in | Wt 151.0 lb

## 2021-01-21 DIAGNOSIS — I83811 Varicose veins of right lower extremities with pain: Secondary | ICD-10-CM

## 2021-01-21 NOTE — Progress Notes (Signed)
Hannah [name] is a 60 y.o.female who presents with painful varicose veins of the right leg  Past Medical History:  Diagnosis Date  . Carpal tunnel syndrome, bilateral   . Chickenpox   . GERD (gastroesophageal reflux disease)   . Hypothyroidism 11/28/2019  . Migraine   . Thyroid disease   . Varicose veins of right lower extremity 11/28/2019    Past Surgical History:  Procedure Laterality Date  . HERNIA REPAIR  1990  . TONSILLECTOMY  1965    Current Outpatient Medications  Medication Sig Dispense Refill  . ALPRAZolam (XANAX) 0.5 MG tablet Take 0.5 mg by mouth 2 (two) times daily as needed.    Marland Kitchen aspirin EC 81 MG tablet Take 81 mg by mouth daily.    Marland Kitchen levothyroxine (SYNTHROID) 88 MCG tablet TAKE 1 TABLET DAILY 90 tablet 3  . pyridOXINE (VITAMIN B-6) 100 MG tablet Take 100 mg by mouth daily.    Marland Kitchen terbinafine (LAMISIL) 250 MG tablet terbinafine HCl 250 mg tablet     No current facility-administered medications for this visit.    Allergies  Allergen Reactions  . Codeine Nausea And Vomiting and Rash    Indication: Patient presents with symptomatic varicose veins of the right lower extremity.  Procedure: Foam sclerotherapy was performed on the right lower extremity. Using ultrasound guidance, 5 mL of foam Sotradecol was used to inject the varicosities of the right lower extremity. Compression wraps were placed. The patient tolerated the procedure well.

## 2021-01-22 ENCOUNTER — Encounter: Payer: Self-pay | Admitting: Adult Health

## 2021-01-22 ENCOUNTER — Ambulatory Visit (INDEPENDENT_AMBULATORY_CARE_PROVIDER_SITE_OTHER): Payer: BC Managed Care – PPO | Admitting: Adult Health

## 2021-01-22 VITALS — BP 90/60 | HR 80 | Temp 96.9°F | Ht 65.0 in | Wt 151.2 lb

## 2021-01-22 DIAGNOSIS — E039 Hypothyroidism, unspecified: Secondary | ICD-10-CM | POA: Diagnosis not present

## 2021-01-22 DIAGNOSIS — E785 Hyperlipidemia, unspecified: Secondary | ICD-10-CM

## 2021-01-22 DIAGNOSIS — E559 Vitamin D deficiency, unspecified: Secondary | ICD-10-CM

## 2021-01-22 DIAGNOSIS — L719 Rosacea, unspecified: Secondary | ICD-10-CM | POA: Diagnosis not present

## 2021-01-22 DIAGNOSIS — Z1231 Encounter for screening mammogram for malignant neoplasm of breast: Secondary | ICD-10-CM | POA: Insufficient documentation

## 2021-01-22 DIAGNOSIS — E663 Overweight: Secondary | ICD-10-CM

## 2021-01-22 MED ORDER — DOXYCYCLINE 40 MG PO CPDR: 40.0000 mg | DELAYED_RELEASE_CAPSULE | ORAL | 1 refills | Status: AC

## 2021-01-22 MED ORDER — LEVOTHYROXINE SODIUM 88 MCG PO TABS: 88.0000 ug | ORAL_TABLET | Freq: Every day | ORAL | 1 refills | Status: DC

## 2021-01-22 NOTE — Progress Notes (Signed)
New Patient Office Visit  Subjective:  Patient ID: Hannah [name], [name]    DOB: 02/03/1961  Age: 60 y.o. MRN: [id number]  CC:  Chief Complaint  Patient presents with  . Transitions Of Care    Pt would like blood work done for thyroid.    HPI Hannah [name] presents for care for chronic conditions, she needs refill on her synthroid she reports she has been on Synthroid 88 mcg once daily for " years" denies any dose change. Denies any new or worsening symptoms./   Due for screening mammogram in June.   Does have some flares of Rosacea on her flares on  face  Cheeks mostly and comes and goes has flares- chronic. Has not tried any treatments. Declined dermatology at this time.   Patient  denies any fever, body aches,chills, chest pain, shortness of breath, nausea, vomiting, or diarrhea.  Denies dizziness, lightheadedness, pre syncopal or syncopal episodes.      Past Medical History:  Diagnosis Date  . Carpal tunnel syndrome, bilateral   . Chickenpox   . GERD (gastroesophageal reflux disease)   . Hypothyroidism 11/28/2019  . Migraine   . Thyroid disease   . Varicose veins of right lower extremity 11/28/2019    Past Surgical History:  Procedure Laterality Date  . HERNIA REPAIR  1990  . TONSILLECTOMY  1965    Family History  Problem Relation Age of Onset  . Arthritis Mother   . Hearing loss Mother   . Alcohol abuse Father   . Arthritis Father   . Cancer Father   . Depression Father   . Hearing loss Father   . Heart attack Father   . Hyperlipidemia Father   . Stroke Father   . Hyperlipidemia Sister   . Colon polyps Sister   . Hyperlipidemia Brother   . Hypertension Brother   . Breast cancer Paternal Aunt     Social History   Socioeconomic History  . Marital status: Legally Separated    Spouse name: Not on file  . Number of children: Not on file  . Years of education: Not on file  . Highest education level: Not on file  Occupational History  . Not on file   Tobacco Use  . Smoking status: Never Smoker  . Smokeless tobacco: Never Used  Vaping Use  . Vaping Use: Never used  Substance and Sexual Activity  . Alcohol use: Never  . Drug use: Never  . Sexual activity: Not Currently  Other Topics Concern  . Not on file  Social History Narrative  . Not on file   Social Determinants of Health   Financial Resource Strain: Not on file  Food Insecurity: Not on file  Transportation Needs: Not on file  Physical Activity: Not on file  Stress: Not on file  Social Connections: Not on file  Intimate Partner Violence: Not on file    ROS Review of Systems  Constitutional: Negative.   HENT: Negative.   Eyes: Negative.   Respiratory: Negative.   Cardiovascular: Negative.   Gastrointestinal: Negative.   Genitourinary: Negative.   Musculoskeletal: Negative.   Skin: Positive for color change and rash.  Neurological: Negative.   Hematological: Negative.   Psychiatric/Behavioral: Negative.     Objective:   Today's Vitals: BP 90/60 (BP Location: Left Arm, Patient Position: Sitting)   Pulse 80   Temp (!) 96.9 F (36.1 C)   Ht 5\' 5"  (1.651 m)   Wt 151 lb 3.2 oz (68.6 kg)  SpO2 97%   BMI 25.16 kg/m   Physical Exam Vitals reviewed.  Constitutional:      General: She is not in acute distress.    Appearance: She is well-developed. She is not ill-appearing, toxic-appearing or diaphoretic.     Interventions: She is not intubated. HENT:     Head: Normocephalic and atraumatic.     Right Ear: External ear normal.     Left Ear: External ear normal.     Nose: Nose normal.     Mouth/Throat:     Pharynx: No oropharyngeal exudate.  Eyes:     General: Lids are normal. No scleral icterus.       Right eye: No discharge.        Left eye: No discharge.     Conjunctiva/sclera: Conjunctivae normal.     Right eye: Right conjunctiva is not injected. No exudate or hemorrhage.    Left eye: Left conjunctiva is not injected. No exudate or hemorrhage.     Pupils: Pupils are equal, round, and reactive to light.  Neck:     Thyroid: No thyroid mass or thyromegaly.     Vascular: Normal carotid pulses. No carotid bruit, hepatojugular reflux or JVD.     Trachea: Trachea and phonation normal. No tracheal tenderness or tracheal deviation.     Meningeal: Brudzinski's sign and Kernig's sign absent.  Cardiovascular:     Rate and Rhythm: Normal rate and regular rhythm.     Pulses: Normal pulses.          Radial pulses are 2+ on the right side and 2+ on the left side.       Dorsalis pedis pulses are 2+ on the right side and 2+ on the left side.       Posterior tibial pulses are 2+ on the right side and 2+ on the left side.     Heart sounds: Normal heart sounds, S1 normal and S2 normal. Heart sounds not distant. No murmur heard. No friction rub. No gallop.   Pulmonary:     Effort: Pulmonary effort is normal. No tachypnea, bradypnea, accessory muscle usage or respiratory distress. She is not intubated.     Breath sounds: Normal breath sounds. No stridor. No wheezing, rhonchi or rales.  Chest:     Chest wall: No tenderness.  Breasts:     Right: No supraclavicular adenopathy.     Left: No supraclavicular adenopathy.    Abdominal:     General: Bowel sounds are normal. There is no distension or abdominal bruit.     Palpations: Abdomen is soft. There is no shifting dullness, fluid wave, hepatomegaly, splenomegaly, mass or pulsatile mass.     Tenderness: There is no abdominal tenderness. There is no right CVA tenderness, left CVA tenderness, guarding or rebound.     Hernia: No hernia is present.  Musculoskeletal:        General: No tenderness or deformity. Normal range of motion.     Cervical back: Full passive range of motion without pain, normal range of motion and neck supple. No edema, erythema or rigidity. No spinous process tenderness or muscular tenderness. Normal range of motion.  Lymphadenopathy:     Head:     Right side of head: No submental,  submandibular, tonsillar, preauricular, posterior auricular or occipital adenopathy.     Left side of head: No submental, submandibular, tonsillar, preauricular, posterior auricular or occipital adenopathy.     Cervical: No cervical adenopathy.     Right cervical: No superficial,  deep or posterior cervical adenopathy.    Left cervical: No superficial, deep or posterior cervical adenopathy.     Upper Body:     Right upper body: No supraclavicular or pectoral adenopathy.     Left upper body: No supraclavicular or pectoral adenopathy.  Skin:    General: Skin is warm and dry.     Coloration: Skin is not pale.     Findings: Erythema and rash present. No abrasion, bruising, burn, ecchymosis, lesion or petechiae. Rash is macular and papular. Rash is not crusting.     Nails: There is no clubbing.     Comments: Skin with small pimples with mildly erythematous cheeks sporadic on face   Neurological:     Mental Status: She is alert and oriented to person, place, and time.     GCS: GCS eye subscore is 4. GCS verbal subscore is 5. GCS motor subscore is 6.     Cranial Nerves: No cranial nerve deficit.     Sensory: No sensory deficit.     Motor: No weakness, tremor, atrophy, abnormal muscle tone or seizure activity.     Coordination: Coordination normal.     Gait: Gait normal.     Deep Tendon Reflexes: Reflexes are normal and symmetric. Reflexes normal. Babinski sign absent on the right side. Babinski sign absent on the left side.     Reflex Scores:      Tricep reflexes are 2+ on the right side and 2+ on the left side.      Bicep reflexes are 2+ on the right side and 2+ on the left side.      Brachioradialis reflexes are 2+ on the right side and 2+ on the left side.      Patellar reflexes are 2+ on the right side and 2+ on the left side.      Achilles reflexes are 2+ on the right side and 2+ on the left side. Psychiatric:        Mood and Affect: Mood normal.        Speech: Speech normal.         Behavior: Behavior normal.        Thought Content: Thought content normal.        Judgment: Judgment normal.     Assessment & Plan:   Problem List Items Addressed This Visit      Endocrine   Hypothyroidism   Relevant Medications   levothyroxine (SYNTHROID) 88 MCG tablet   Other Relevant Orders   TSH     Musculoskeletal and Integument   Rosacea   Relevant Orders   CBC with Differential/Platelet   Comprehensive metabolic panel     Other   Hyperlipidemia   Relevant Orders   Lipid panel   Overweight with body mass index (BMI) 25.0-29.9   Screening mammogram for breast cancer - Primary   Relevant Orders   MM Digital Screening   Vitamin D insufficiency   Relevant Orders   VITAMIN D 25 Hydroxy (Vit-D Deficiency, Fractures)      Outpatient Encounter Medications as of 01/22/2021  Medication Sig  . aspirin EC 81 MG tablet Take 81 mg by mouth daily.  . cholecalciferol (VITAMIN D3) 25 MCG (1000 UNIT) tablet Take 1,000 Units by mouth daily.  Marland Kitchen doxycycline (ORACEA) 40 MG capsule Take 1 capsule (40 mg total) by mouth every morning.  . pyridOXINE (VITAMIN B-6) 100 MG tablet Take 100 mg by mouth daily.  . [DISCONTINUED] levothyroxine (SYNTHROID) 88 MCG tablet TAKE  1 TABLET DAILY  . levothyroxine (SYNTHROID) 88 MCG tablet Take 1 tablet (88 mcg total) by mouth daily.  . [DISCONTINUED] ALPRAZolam (XANAX) 0.5 MG tablet Take 0.5 mg by mouth 2 (two) times daily as needed. (Patient not taking: Reported on 01/22/2021)  . [DISCONTINUED] terbinafine (LAMISIL) 250 MG tablet terbinafine HCl 250 mg tablet (Patient not taking: Reported on 01/22/2021)   No facility-administered encounter medications on file as of 01/22/2021.   Orders Placed This Encounter  Procedures  . MM Digital Screening    Standing Status:   Future    Standing Expiration Date:   02/22/2021    Order Specific Question:   Reason for Exam (SYMPTOM  OR DIAGNOSIS REQUIRED)    Answer:   screening    Order Specific Question:   Is the  patient pregnant?    Answer:   No    Order Specific Question:   Preferred imaging location?    Answer:   [facility]  . CBC with Differential/Platelet    Standing Status:   Future    Standing Expiration Date:   01/22/2022  . Comprehensive metabolic panel    Standing Status:   Future    Standing Expiration Date:   01/22/2022  . TSH    Standing Status:   Future    Standing Expiration Date:   01/22/2022  . Lipid panel    Standing Status:   Future    Standing Expiration Date:   01/22/2022  . VITAMIN D 25 Hydroxy (Vit-D Deficiency, Fractures)    Standing Status:   Future    Standing Expiration Date:   01/22/2022   Discussed doxycycline, possible side effects , can try for rosacea and if not improved in one month recommend dermatology referral. Labs in 3 months. Last labs 3 months ago. Labs ordered.  Return precautions given. Red Flags discussed. The patient was given clear instructions to go to ER or return to medical center if any red flags develop, symptoms do not improve, worsen or new problems develop. They verbalized understanding.    Risks, benefits, and alternatives of the medications and treatment plan prescribed today were discussed, and patient expressed understanding.    Education regarding symptom management and diagnosis given to patient on AVS.  Patient was in agreement with treatment plan.   Continue to follow with  Eula Fried. Mordche Hedglin AGNP-C, FNP-C for routine health maintenance.   Eula Fried. Mikella Linsley AGNP-C, FNP-C  Follow-up: Return in about 3 months (around 04/24/2021), or if symptoms worsen or fail to improve, for at any time for any worsening symptoms, Go to Emergency room/ urgent care if worse.   Jairo Ben, FNP

## 2021-01-22 NOTE — Patient Instructions (Addendum)
Call to schedule your screening mammogram. Your orders have been placed for your exam.  Let our office know if you have questions, concerns, or any difficulty scheduling.  If normal results then yearly screening mammograms are recommended unless you notice  Changes in your breast then you should schedule a follow up office visit. If abnormal results  Further imaging will be warranted and sooner follow up as determined by the radiologist at the [facility].   [facility] [place], [place] [address]  Main: [phone]     Labs in 6 months with follow up.    Doxycycline tablets or capsules What is this medicine? DOXYCYCLINE (dox i SYE kleen) is a tetracycline antibiotic. It kills certain bacteria or stops their growth. It is used to treat many kinds of infections, like dental, skin, respiratory, and urinary tract infections. It also treats acne, Lyme disease, malaria, and certain sexually transmitted infections. This medicine may be used for other purposes; ask your health care provider or pharmacist if you have questions. COMMON BRAND NAME(S): Acticlate, Adoxa, Adoxa CK, Adoxa Pak, Adoxa TT, Alodox, Avidoxy, Doxal, LYMEPAK, Mondoxyne NL, Monodox, Morgidox 1x, Morgidox 1x Kit, Morgidox 2x, Morgidox 2x Kit, NutriDox, Ocudox, [place], [place], [place], Vibra-Tabs, Vibramycin What should I tell my health care provider before I take this medicine? They need to know if you have any of these conditions:  liver disease  long exposure to sunlight like working outdoors  stomach problems like colitis  an unusual or allergic reaction to doxycycline, tetracycline antibiotics, other medicines, foods, dyes, or preservatives  pregnant or trying to get pregnant  breast-feeding How should I use this medicine? Take this medicine by mouth with a full glass of water. Follow the directions on the prescription label. It is best to take this medicine  without food, but if it upsets your stomach take it with food. Take your medicine at regular intervals. Do not take your medicine more often than directed. Take all of your medicine as directed even if you think you are better. Do not skip doses or stop your medicine early. Talk to your pediatrician regarding the use of this medicine in children. While this drug may be prescribed for selected conditions, precautions do apply. Overdosage: If you think you have taken too much of this medicine contact a poison control center or emergency room at once. NOTE: This medicine is only for you. Do not share this medicine with others. What if I miss a dose? If you miss a dose, take it as soon as you can. If it is almost time for your next dose, take only that dose. Do not take double or extra doses. What may interact with this medicine?  antacids  barbiturates  birth control pills  bismuth subsalicylate  carbamazepine  methoxyflurane  other antibiotics  phenytoin  vitamins that contain iron  warfarin This list may not describe all possible interactions. Give your health care provider a list of all the medicines, herbs, non-prescription drugs, or dietary supplements you use. Also tell them if you smoke, drink alcohol, or use illegal drugs. Some items may interact with your medicine. What should I watch for while using this medicine? Tell your doctor or health care professional if your symptoms do not improve. Do not treat diarrhea with over the counter products. Contact your doctor if you have diarrhea that lasts more than 2 days or if it is severe and watery. Do not take this medicine just before  going to bed. It may not dissolve properly when you lay down and can cause pain in your throat. Drink plenty of fluids while taking this medicine to also help reduce irritation in your throat. This medicine can make you more sensitive to the sun. Keep out of the sun. If you cannot avoid being in the  sun, wear protective clothing and use sunscreen. Do not use sun lamps or tanning beds/booths. Birth control pills may not work properly while you are taking this medicine. Talk to your doctor about using an extra method of birth control. If you are being treated for a sexually transmitted infection, avoid sexual contact until you have finished your treatment. Your sexual partner may also need treatment. Avoid antacids, aluminum, calcium, magnesium, and iron products for 4 hours before and 2 hours after taking a dose of this medicine. If you are using this medicine to prevent malaria, you should still protect yourself from contact with mosquitos. Stay in screened-in areas, use mosquito nets, keep your body covered, and use an insect repellent. What side effects may I notice from receiving this medicine? Side effects that you should report to your doctor or health care professional as soon as possible:  allergic reactions like skin rash, itching or hives, swelling of the face, lips, or tongue  difficulty breathing  fever  itching in the rectal or genital area  pain on swallowing  rash, fever, and swollen lymph nodes  redness, blistering, peeling or loosening of the skin, including inside the mouth  severe stomach pain or cramps  unusual bleeding or bruising  unusually weak or tired  yellowing of the eyes or skin Side effects that usually do not require medical attention (report to your doctor or health care professional if they continue or are bothersome):  diarrhea  loss of appetite  nausea, vomiting This list may not describe all possible side effects. Call your doctor for medical advice about side effects. You may report side effects to FDA at 1-800-FDA-1088. Where should I keep my medicine? Keep out of the reach of children. Store at room temperature, below 30 degrees C (86 degrees F). Protect from light. Keep container tightly closed. Throw away any unused medicine after the  expiration date. Taking this medicine after the expiration date can make you seriously ill. NOTE: This sheet is a summary. It may not cover all possible information. If you have questions about this medicine, talk to your doctor, pharmacist, or health care provider.  2021 Elsevier/Gold Standard (2018-12-08 13:44:53)  Rosacea Rosacea is a long-term (chronic) condition that affects the skin of the face, including the cheeks, nose, forehead, and chin. This condition can also affect the eyes. Rosacea causes blood vessels near the surface of the skin to get bigger (be enlarged), and that makes the skin red. What are the causes? The cause of this condition is not known. Certain things can make rosacea worse, including:  Hot baths.  Exercise.  Sunlight.  Very hot or cold temperatures.  Hot or spicy foods and drinks.  Drinking alcohol.  Stress.  Taking blood pressure medicine.  Long-term use of topical steroids on the face. What increases the risk? You are more likely to get this condition if you:  Are older than 60 years of age.  Are a woman.  Have light-colored skin (light complexion).  Have a family history of the condition. What are the signs or symptoms?  Redness of the face.  Red bumps or pimples on the face.  A red,  enlarged nose.  Blushing easily.  Red lines on the skin.  Irritated, burning, or itchy feeling in the eyes.  Swollen eyelids.  Drainage from the eyes.  Feeling like there is something in your eye.   How is this treated? There is no cure for this condition, but treatment can help to control your symptoms. Your doctor may suggest that you see a skin specialist (dermatologist). Treatment may include:  Medicines that are put on the skin or taken by mouth (orally).  Laser treatment to improve how the skin looks.  Surgery. This is rare. Your doctor will also suggest the best way to take care of your skin. Even after your skin gets better, you will  likely need to continue treatment to keep your rosacea from coming back. Follow these instructions at home: Skin care Take care of your skin as told by your doctor. Your doctor may tell you to do these things:  Wash your skin gently two or more times each day.  Use mild soap.  Use a sunscreen or sunblock with SPF 30 or greater.  Use gentle cosmetics that are meant for sensitive skin.  Shave with an electric shaver instead of a blade. Lifestyle  Try to keep track of what foods make this condition worse. Avoid those foods. These may include: ? Spicy foods. ? Seafood. ? Cheese. ? Hot liquids. ? Nuts. ? Chocolate. ? Iodized salt.  Do not drink alcohol.  Avoid very cold or hot temperatures.  Try to reduce your stress. If you need help to do this, talk with your doctor.  When you exercise, do these things to stay cool: ? Limit sun exposure to your face. ? Use a fan. ? Exercise for a shorter time, and exercise more often. General instructions  Take and apply over-the-counter and prescription medicines only as told by your doctor.  If you were prescribed an antibiotic medicine, apply it or take it as told by your doctor. Do not stop using the antibiotic even if your condition improves.  If your eyelids are affected, hold warm compresses on them. Do this as told by your doctor.  Keep all follow-up visits as told by your doctor. This is important. Contact a doctor if:  Your symptoms get worse.  Your symptoms do not improve after 2 months of treatment.  You have new symptoms.  You have any changes in how you see (vision) or you have problems with your eyes, such as redness or itching.  You feel very sad (depressed).  You do not want to eat as much as normal (lose your appetite).  You have trouble focusing your mind (concentrating). Summary  Rosacea is a long-term condition that affects the skin of the face, including the cheeks, nose, forehead, and chin.  Take care  of your skin as told by your doctor.  Take and apply medicines only as told by your doctor.  Contact a doctor if your symptoms get worse or if you have problems with your eyes. This information is not intended to replace advice given to you by your health care provider. Make sure you discuss any questions you have with your health care provider. Document Revised: 02/09/2018 Document Reviewed: 02/09/2018 Elsevier Patient Education  2021 [place] Maintenance, Female Adopting a healthy lifestyle and getting preventive care are important in promoting health and wellness. Ask your health care provider about:  The right schedule for you to have regular tests and exams.  Things you can do on your  own to prevent diseases and keep yourself healthy. What should I know about diet, weight, and exercise? Eat a healthy diet  Eat a diet that includes plenty of vegetables, fruits, low-fat dairy products, and lean protein.  Do not eat a lot of foods that are high in solid fats, added sugars, or sodium.   Maintain a healthy weight Body mass index (BMI) is used to identify weight problems. It estimates body fat based on height and weight. Your health care provider can help determine your BMI and help you achieve or maintain a healthy weight. Get regular exercise Get regular exercise. This is one of the most important things you can do for your health. Most adults should:  Exercise for at least 150 minutes each week. The exercise should increase your heart rate and make you sweat (moderate-intensity exercise).  Do strengthening exercises at least twice a week. This is in addition to the moderate-intensity exercise.  Spend less time sitting. Even light physical activity can be beneficial. Watch cholesterol and blood lipids Have your blood tested for lipids and cholesterol at 60 years of age, then have this test every 5 years. Have your cholesterol levels checked more often if:  Your lipid  or cholesterol levels are high.  You are older than 60 years of age.  You are at high risk for heart disease. What should I know about cancer screening? Depending on your health history and family history, you may need to have cancer screening at various ages. This may include screening for:  Breast cancer.  Cervical cancer.  Colorectal cancer.  Skin cancer.  Lung cancer. What should I know about heart disease, diabetes, and high blood pressure? Blood pressure and heart disease  High blood pressure causes heart disease and increases the risk of stroke. This is more likely to develop in people who have high blood pressure readings, are of African descent, or are overweight.  Have your blood pressure checked: ? Every 3-5 years if you are 42-50 years of age. ? Every year if you are 16 years old or older. Diabetes Have regular diabetes screenings. This checks your fasting blood sugar level. Have the screening done:  Once every three years after age 73 if you are at a normal weight and have a low risk for diabetes.  More often and at a younger age if you are overweight or have a high risk for diabetes. What should I know about preventing infection? Hepatitis B If you have a higher risk for hepatitis B, you should be screened for this virus. Talk with your health care provider to find out if you are at risk for hepatitis B infection. Hepatitis C Testing is recommended for:  Everyone born from [birth year] through 1965.  Anyone with known risk factors for hepatitis C. Sexually transmitted infections (STIs)  Get screened for STIs, including gonorrhea and chlamydia, if: ? You are sexually active and are younger than 60 years of age. ? You are older than 60 years of age and your health care provider tells you that you are at risk for this type of infection. ? Your sexual activity has changed since you were last screened, and you are at increased risk for chlamydia or gonorrhea. Ask your  health care provider if you are at risk.  Ask your health care provider about whether you are at high risk for HIV. Your health care provider may recommend a prescription medicine to help prevent HIV infection. If you choose to take medicine to prevent  HIV, you should first get tested for HIV. You should then be tested every 3 months for as long as you are taking the medicine. Pregnancy  If you are about to stop having your period (premenopausal) and you may become pregnant, seek counseling before you get pregnant.  Take 400 to 800 micrograms (mcg) of folic acid every day if you become pregnant.  Ask for birth control (contraception) if you want to prevent pregnancy. Osteoporosis and menopause Osteoporosis is a disease in which the bones lose minerals and strength with aging. This can result in bone fractures. If you are 71 years old or older, or if you are at risk for osteoporosis and fractures, ask your health care provider if you should:  Be screened for bone loss.  Take a calcium or vitamin D supplement to lower your risk of fractures.  Be given hormone replacement therapy (HRT) to treat symptoms of menopause. Follow these instructions at home: Lifestyle  Do not use any products that contain nicotine or tobacco, such as cigarettes, e-cigarettes, and chewing tobacco. If you need help quitting, ask your health care provider.  Do not use street drugs.  Do not share needles.  Ask your health care provider for help if you need support or information about quitting drugs. Alcohol use  Do not drink alcohol if: ? Your health care provider tells you not to drink. ? You are pregnant, may be pregnant, or are planning to become pregnant.  If you drink alcohol: ? Limit how much you use to 0-1 drink a day. ? Limit intake if you are breastfeeding.  Be aware of how much alcohol is in your drink. In the [place] one drink equals one 12 oz bottle of beer (355 mL), one 5 oz glass of wine (148  mL), or one 1 oz glass of hard liquor (44 mL). General instructions  Schedule regular health, dental, and eye exams.  Stay current with your vaccines.  Tell your health care provider if: ? You often feel depressed. ? You have ever been abused or do not feel safe at home. Summary  Adopting a healthy lifestyle and getting preventive care are important in promoting health and wellness.  Follow your health care provider's instructions about healthy diet, exercising, and getting tested or screened for diseases.  Follow your health care provider's instructions on monitoring your cholesterol and blood pressure. This information is not intended to replace advice given to you by your health care provider. Make sure you discuss any questions you have with your health care provider. Document Revised: 08/31/2018 Document Reviewed: 08/31/2018 Elsevier Patient Education  2021 [employer].

## 2021-02-04 IMAGING — MG MM DIGITAL DIAGNOSTIC UNILAT*L* W/ TOMO W/ CAD
6 series · 6 of 18 positions shown · non-contrast
Comparison: Previous exam(s).

CLINICAL DATA: Patient recalled from screening for possible left
breast distortion.

EXAM:
DIGITAL DIAGNOSTIC UNILATERAL LEFT MAMMOGRAM WITH TOMO AND CAD

[L CC synth-2D (1 of 2)]
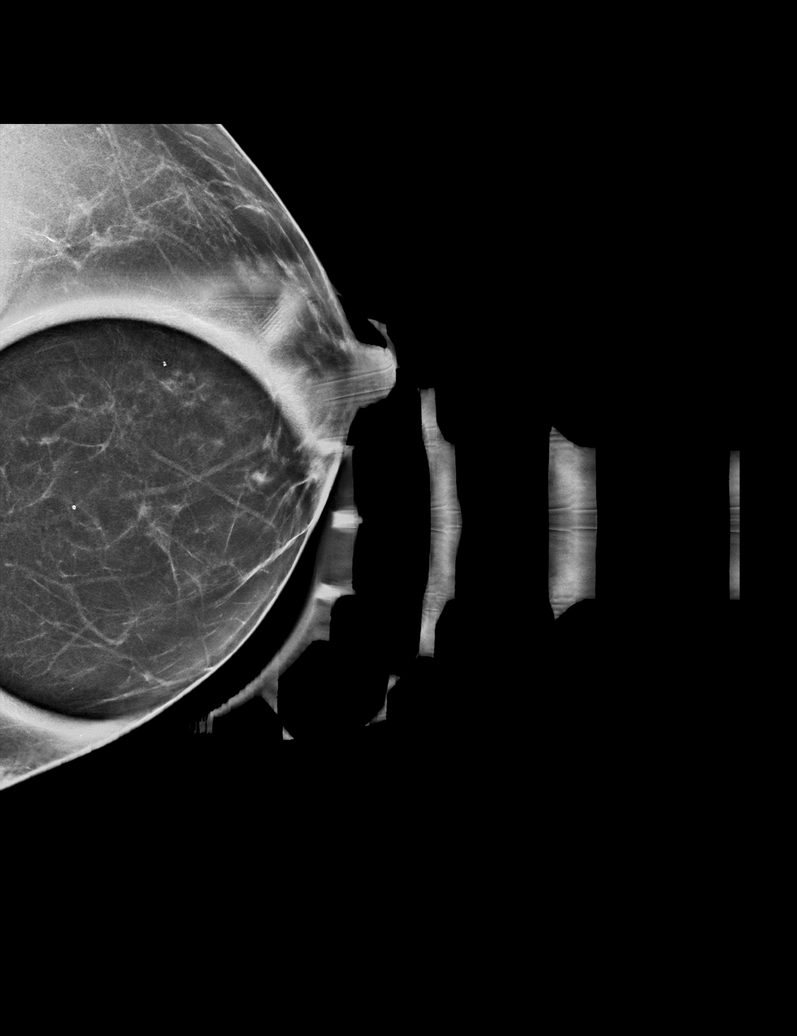

[L CC synth-2D (2 of 2)]
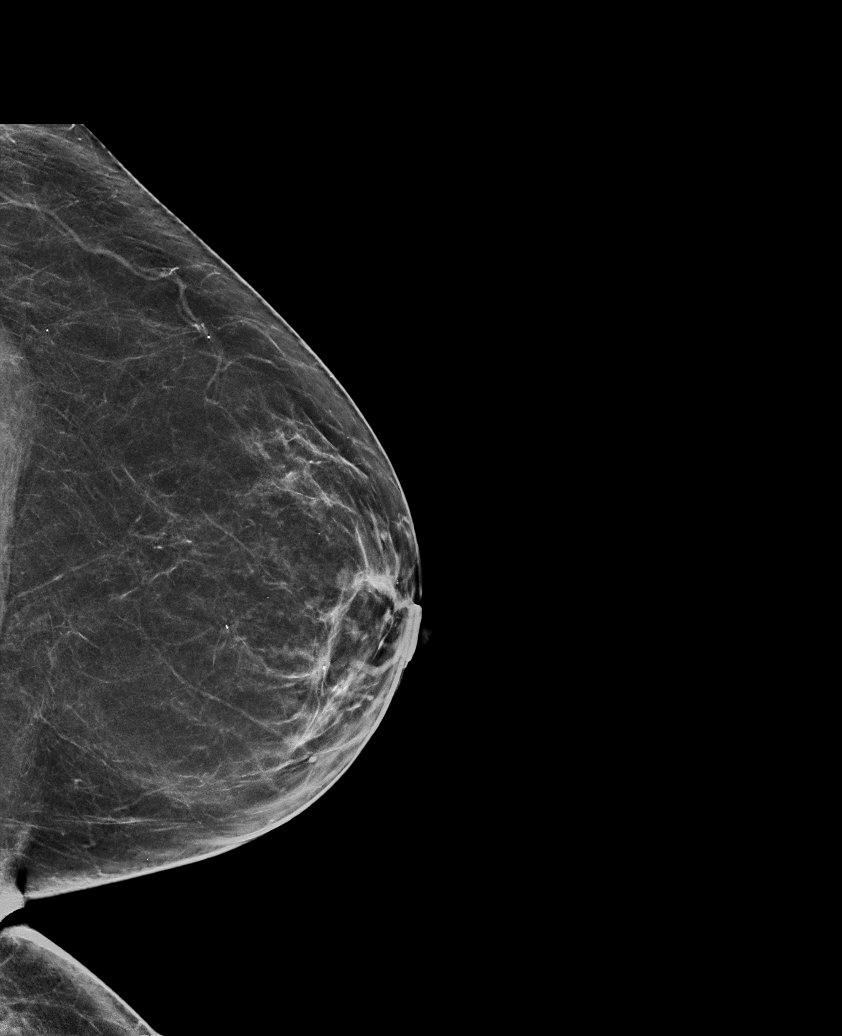

[L ML synth-2D]
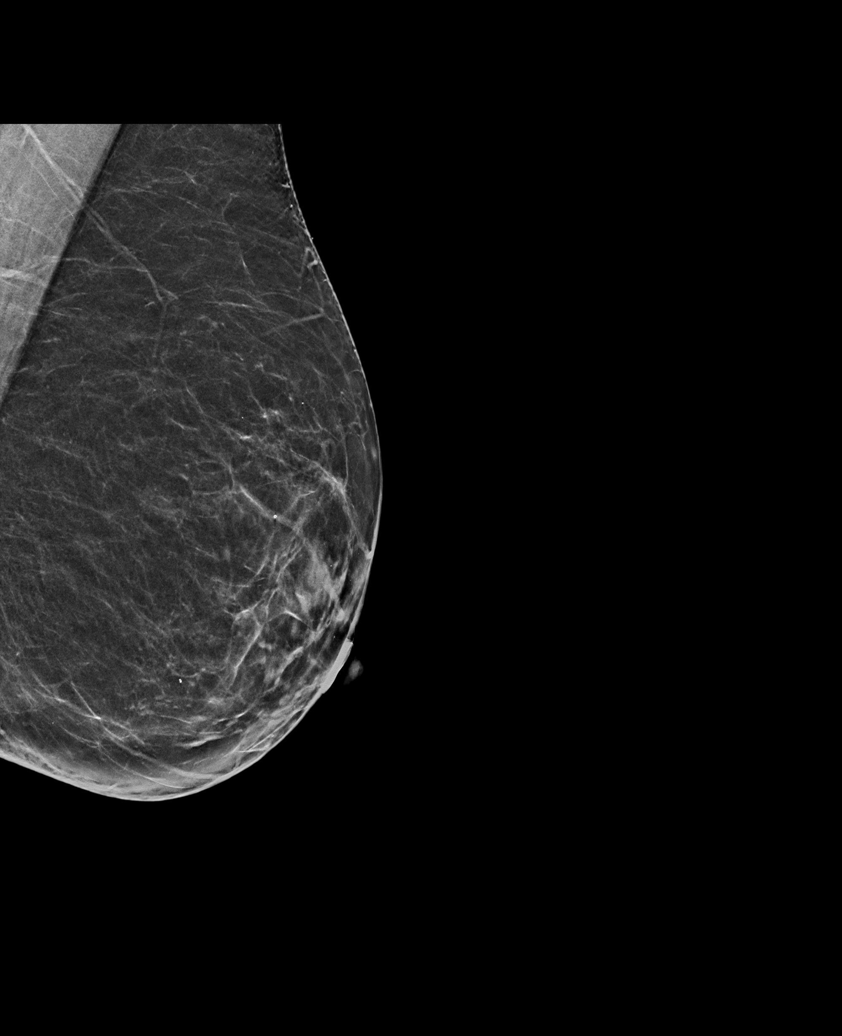

[L ML tomo · tomo slice 31/60.0]
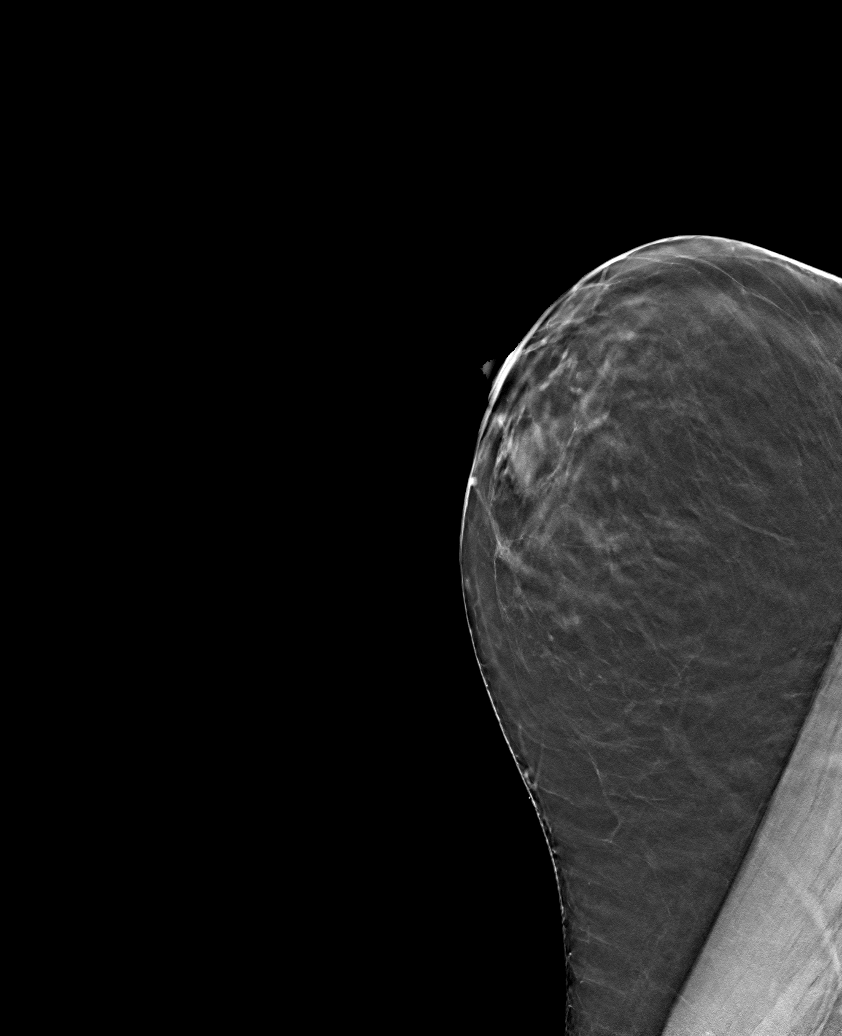

[L CC tomo (1 of 2) · tomo slice 26/51.0]
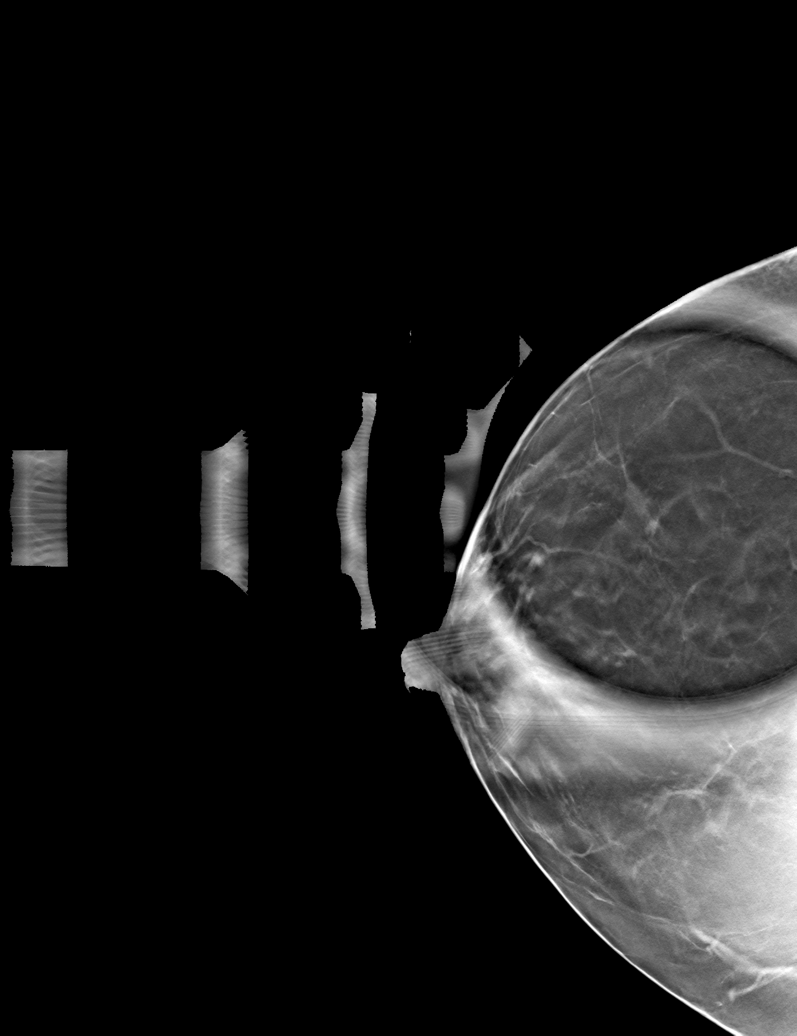

[L CC tomo (2 of 2) · tomo slice 33/64.0]
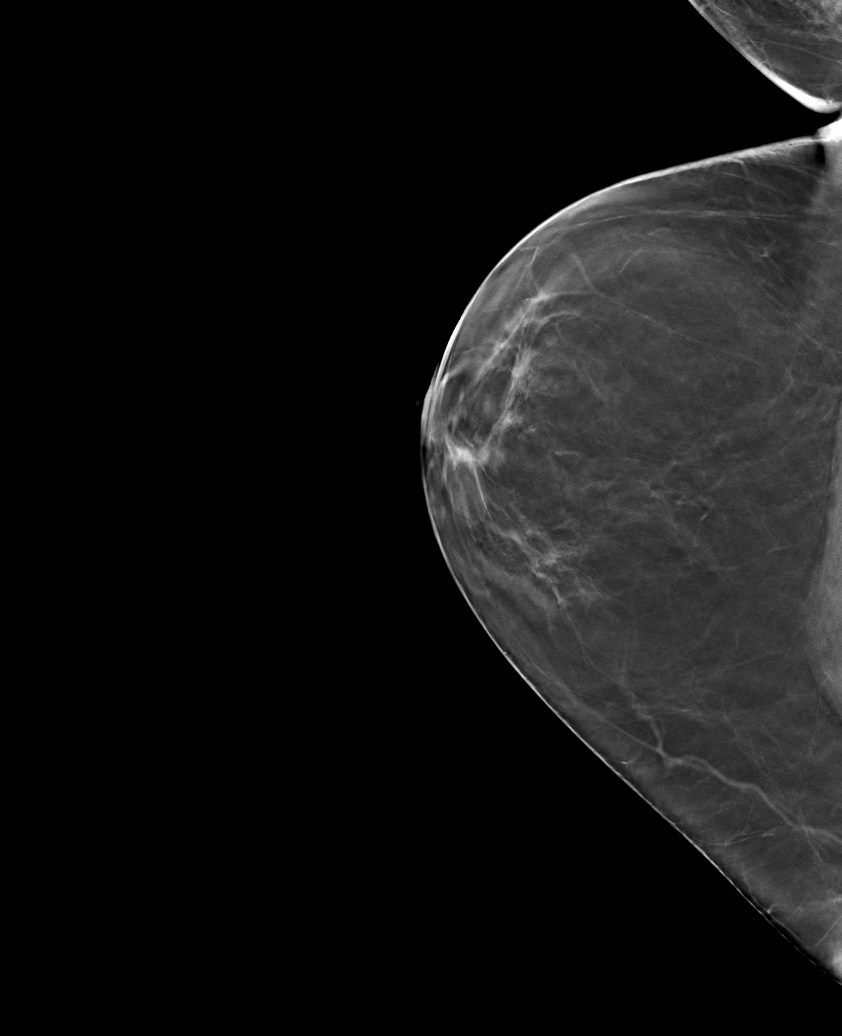

[6 of 18 positions shown; findings below may reference images not displayed]

ACR Breast Density Category b: There are scattered areas of
fibroglandular density.
FINDINGS: Questioned distortion within the medial left breast on the cc view
resolved with additional imaging compatible with overlapping dense
fibroglandular tissue. No suspicious findings.

Mammographic images were processed with CAD.
IMPRESSION: No mammographic evidence for malignancy.

RECOMMENDATION:
Screening mammogram in one year.(Code:[id number])

I have discussed the findings and recommendations with the patient.
If applicable, a reminder letter will be sent to the patient
regarding the next appointment.

BI-RADS CATEGORY  1: Negative.

## 2021-02-11 ENCOUNTER — Ambulatory Visit (INDEPENDENT_AMBULATORY_CARE_PROVIDER_SITE_OTHER): Payer: BC Managed Care – PPO | Admitting: Vascular Surgery

## 2021-02-11 ENCOUNTER — Other Ambulatory Visit: Payer: Self-pay

## 2021-02-11 ENCOUNTER — Encounter (INDEPENDENT_AMBULATORY_CARE_PROVIDER_SITE_OTHER): Payer: Self-pay | Admitting: Vascular Surgery

## 2021-02-11 VITALS — BP 109/76 | HR 90 | Resp 16 | Wt 149.8 lb

## 2021-02-11 DIAGNOSIS — I83811 Varicose veins of right lower extremities with pain: Secondary | ICD-10-CM | POA: Diagnosis not present

## 2021-02-11 NOTE — Progress Notes (Signed)
Hannah [name] is a 60 y.o.female who presents with painful varicose veins of the right leg  Past Medical History:  Diagnosis Date   Carpal tunnel syndrome, bilateral    Chickenpox    GERD (gastroesophageal reflux disease)    Hypothyroidism 11/28/2019   Migraine    Thyroid disease    Varicose veins of right lower extremity 11/28/2019    Past Surgical History:  Procedure Laterality Date   HERNIA REPAIR  1990   TONSILLECTOMY  1965    Current Outpatient Medications  Medication Sig Dispense Refill   aspirin EC 81 MG tablet Take 81 mg by mouth daily.     cholecalciferol (VITAMIN D3) 25 MCG (1000 UNIT) tablet Take 1,000 Units by mouth daily.     doxycycline (ORACEA) 40 MG capsule Take 1 capsule (40 mg total) by mouth every morning. 30 capsule 1   levothyroxine (SYNTHROID) 88 MCG tablet Take 1 tablet (88 mcg total) by mouth daily. 90 tablet 1   pyridOXINE (VITAMIN B-6) 100 MG tablet Take 100 mg by mouth daily.     No current facility-administered medications for this visit.    Allergies  Allergen Reactions   Codeine Nausea And Vomiting and Rash    Indication: Patient presents with symptomatic varicose veins of the right lower extremity.  Procedure: Foam sclerotherapy was performed on the right lower extremity. Using ultrasound guidance, 5 mL of foam Sotradecol was used to inject the varicosities of the right lower extremity. Compression wraps were placed. The patient tolerated the procedure well.  

## 2021-03-11 ENCOUNTER — Ambulatory Visit (INDEPENDENT_AMBULATORY_CARE_PROVIDER_SITE_OTHER): Payer: BC Managed Care – PPO | Admitting: Vascular Surgery

## 2021-03-28 ENCOUNTER — Ambulatory Visit (INDEPENDENT_AMBULATORY_CARE_PROVIDER_SITE_OTHER): Payer: BC Managed Care – PPO | Admitting: Vascular Surgery

## 2021-03-28 ENCOUNTER — Other Ambulatory Visit: Payer: Self-pay

## 2021-03-28 ENCOUNTER — Encounter (INDEPENDENT_AMBULATORY_CARE_PROVIDER_SITE_OTHER): Payer: Self-pay | Admitting: Vascular Surgery

## 2021-03-28 VITALS — BP 117/75 | HR 76 | Resp 16 | Wt 153.6 lb

## 2021-03-28 DIAGNOSIS — I83811 Varicose veins of right lower extremities with pain: Secondary | ICD-10-CM | POA: Diagnosis not present

## 2021-03-28 NOTE — Progress Notes (Signed)
Hannah [name] is a 60 y.o.female who presents with painful varicose veins of the right leg  Past Medical History:  Diagnosis Date   Carpal tunnel syndrome, bilateral    Chickenpox    GERD (gastroesophageal reflux disease)    Hypothyroidism 11/28/2019   Migraine    Thyroid disease    Varicose veins of right lower extremity 11/28/2019    Past Surgical History:  Procedure Laterality Date   HERNIA REPAIR  1990   TONSILLECTOMY  1965    Current Outpatient Medications  Medication Sig Dispense Refill   aspirin EC 81 MG tablet Take 81 mg by mouth daily.     cholecalciferol (VITAMIN D3) 25 MCG (1000 UNIT) tablet Take 1,000 Units by mouth daily.     doxycycline (ORACEA) 40 MG capsule Take 1 capsule (40 mg total) by mouth every morning. 30 capsule 1   levothyroxine (SYNTHROID) 88 MCG tablet Take 1 tablet (88 mcg total) by mouth daily. 90 tablet 1   pyridOXINE (VITAMIN B-6) 100 MG tablet Take 100 mg by mouth daily.     No current facility-administered medications for this visit.    Allergies  Allergen Reactions   Codeine Nausea And Vomiting and Rash    Indication: Patient presents with symptomatic varicose veins of the right lower extremity.  Procedure: Foam sclerotherapy was performed on the right lower extremity. Using ultrasound guidance, 5 mL of foam Sotradecol was used to inject the varicosities of the right lower extremity. Compression wraps were placed. The patient tolerated the procedure well.

## 2021-06-02 ENCOUNTER — Other Ambulatory Visit: Payer: Self-pay | Admitting: Adult Health

## 2021-06-02 DIAGNOSIS — Z1231 Encounter for screening mammogram for malignant neoplasm of breast: Secondary | ICD-10-CM

## 2021-06-10 DIAGNOSIS — D2310 Other benign neoplasm of skin of unspecified eyelid, including canthus: Secondary | ICD-10-CM | POA: Diagnosis not present

## 2021-06-19 ENCOUNTER — Other Ambulatory Visit: Payer: Self-pay

## 2021-06-19 ENCOUNTER — Ambulatory Visit
Admission: RE | Admit: 2021-06-19 | Discharge: 2021-06-19 | Disposition: A | Discharge status: Home / Self Care | Payer: BC Managed Care – PPO | Source: Ambulatory Visit | Attending: Adult Health | Admitting: Adult Health

## 2021-06-19 DIAGNOSIS — Z1231 Encounter for screening mammogram for malignant neoplasm of breast: Secondary | ICD-10-CM | POA: Diagnosis not present

## 2021-07-21 ENCOUNTER — Telehealth: Payer: Self-pay | Admitting: Adult Health

## 2021-07-21 DIAGNOSIS — E039 Hypothyroidism, unspecified: Secondary | ICD-10-CM

## 2021-07-30 ENCOUNTER — Other Ambulatory Visit: Payer: Self-pay

## 2021-07-30 DIAGNOSIS — E039 Hypothyroidism, unspecified: Secondary | ICD-10-CM

## 2021-07-30 MED ORDER — LEVOTHYROXINE SODIUM 88 MCG PO TABS: 88.0000 ug | ORAL_TABLET | Freq: Every day | ORAL | 0 refills | Status: DC

## 2021-07-30 NOTE — Telephone Encounter (Signed)
Patient called and said [facility] has been trying to get through to office about her levothyroxine (SYNTHROID) 88 MCG tablet. Patient still waiting for refill.

## 2021-07-30 NOTE — Telephone Encounter (Signed)
Refill sent.

## 2021-10-06 DIAGNOSIS — L600 Ingrowing nail: Secondary | ICD-10-CM | POA: Diagnosis not present

## 2021-10-06 DIAGNOSIS — B351 Tinea unguium: Secondary | ICD-10-CM | POA: Diagnosis not present

## 2021-10-06 DIAGNOSIS — M79674 Pain in right toe(s): Secondary | ICD-10-CM | POA: Diagnosis not present

## 2021-10-15 ENCOUNTER — Encounter: Payer: Self-pay | Admitting: Obstetrics

## 2021-10-15 ENCOUNTER — Ambulatory Visit (INDEPENDENT_AMBULATORY_CARE_PROVIDER_SITE_OTHER): Payer: BC Managed Care – PPO | Admitting: Obstetrics

## 2021-10-15 ENCOUNTER — Other Ambulatory Visit: Payer: Self-pay

## 2021-10-15 VITALS — BP 122/70 | Ht 65.0 in | Wt 152.0 lb

## 2021-10-15 DIAGNOSIS — Z01419 Encounter for gynecological examination (general) (routine) without abnormal findings: Secondary | ICD-10-CM

## 2021-10-15 NOTE — Progress Notes (Signed)
Gynecology Annual Exam  PCP: Berniece Pap, FNP  Chief Complaint:  Chief Complaint  Patient presents with   Annual Exam    History of Present Illness:Patient is a 61 y.o. No obstetric history on file. presents for annual exam. The patient has no complaints today.   LMP: No LMP recorded. Patient is postmenopausal. The patient is not currently sexually active. She denies dyspareunia.  The patient does perform self breast exams.  There is no notable family history of breast or ovarian cancer in her family.   The patient has regular exercise: yes.    The patient denies current symptoms of depression.     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 Patient is postmenopausal. Last Pap: 2022 Results were:  NILM Last mammogram: 05/2021 Results were: BI-RAD I  Obstetric History: No obstetric history on file.  Family History:  Family History  Problem Relation Age of Onset   Arthritis Mother    Hearing loss Mother    Alcohol abuse Father    Arthritis Father    Cancer Father    Depression Father    Hearing loss Father    Heart attack Father    Hyperlipidemia Father    Stroke Father    Hyperlipidemia Sister    Colon polyps Sister    Hyperlipidemia Brother    Hypertension Brother    Breast cancer Paternal Aunt     Social History:  Social History   Socioeconomic History   Marital  status: Legally Separated    Spouse name: Not on file   Number of children: Not on file   Years of education: Not on file   Highest education level: Not on file  Occupational History   Not on file  Tobacco Use   Smoking status: Never   Smokeless tobacco: Never  Vaping Use   Vaping Use: Never used  Substance and Sexual Activity   Alcohol use: Never   Drug use: Never   Sexual activity: Not Currently    Birth control/protection: Post-menopausal  Other Topics Concern   Not on file  Social History Narrative   Not on file   Social Determinants of Health   Financial Resource Strain: Not on file  Food Insecurity: Not on file  Transportation Needs: Not on file  Physical Activity: Not on file  Stress: Not on file  Social Connections: Not on file  Intimate Partner Violence: Not on file    Allergies:  Allergies  Allergen Reactions   Codeine Nausea And Vomiting and Rash    Medications: Prior to Admission medications   Medication Sig Start Date End Date Taking? Authorizing Provider  aspirin EC 81 MG tablet Take 81 mg by mouth daily.   Yes [provider]  cholecalciferol (VITAMIN D3) 25 MCG (1000 UNIT) tablet Take 1,000 Units by mouth daily.   Yes [provider]  doxycycline (ORACEA) 40  MG capsule Take 1 capsule (40 mg total) by mouth every morning. 01/22/21  Yes Flinchum, Eula Fried, FNP  levothyroxine (SYNTHROID) 88 MCG tablet Take 1 tablet (88 mcg total) by mouth daily. 07/30/21  Yes Flinchum, Eula Fried, FNP  pyridOXINE (VITAMIN B-6) 100 MG tablet Take 100 mg by mouth daily.   Yes [provider]    Physical Exam Vitals: Blood pressure 122/70, height 5\' 5"  (1.651 m), weight 152 lb (68.9 kg).  General: NAD HEENT: normocephalic, anicteric Pulmonary: No increased work of breathing, CTAB Cardiovascular: RRR, distal pulses 2+ Breast: Breast symmetrical, no tenderness, no palpable nodules or masses, no skin or nipple retraction present, no nipple  discharge.  No axillary or supraclavicular lymphadenopathy. Abdomen: NABS, soft, non-tender, non-distended.  Umbilicus without lesions.  No hepatomegaly, splenomegaly or masses palpable. No evidence of hernia  Genitourinary:  External: Normal external female genitalia.  Normal urethral meatus, normal Bartholin's and Skene's glands.    Vagina: Normal vaginal mucosa, no evidence of prolapse.    Cervix: Not observed. No CMT.  Uterus: Non-enlarged, mobile, normal contour.   Adnexa: ovaries non-enlarged, no adnexal masses  Rectal: deferred Extremities: no edema, erythema, or tenderness Neurologic: Grossly intact Psychiatric: mood appropriate, affect full  Female chaperone present for pelvic and breast  portions of the physical exam     Assessment: 61 y.o. No obstetric history on file. routine annual exam No PAP needed.  Plan: Problem List Items Addressed This Visit   None Visit Diagnoses     Women's annual routine gynecological examination    -  Primary       1) Mammogram - recommend yearly screening mammogram.  Mammogram Is up to date  2) STI screening declined, patient reports no concern for STI risk.    not offered and therefore not obtained. Patient is not sexually active.   3) ASCCP guidelines and rational discussed for age group.  Patient opts for every 5 years screening interval.  4) Osteoporosis  - per USPTF routine screening DEXA at age 23, not yet due.    Consider FDA-approved medical therapies in postmenopausal women and men aged 22 years and older, based on the following: a) A hip or vertebral (clinical or morphometric) fracture b) T-score ? -2.5 at the femoral neck or spine after appropriate evaluation to exclude secondary causes C) Low bone mass (T-score between -1.0 and -2.5 at the femoral neck or spine) and a 10-year probability of a hip fracture ? 3% or a 10-year probability of a major osteoporosis-related fracture ? 20% based on the [place]-adapted WHO  algorithm   5) Routine healthcare maintenance including cholesterol, diabetes screening discussed managed by PCP.  6) Colonoscopy up to date.  Last colonoscopy 2019.  7) Return in about 1 year (around 10/15/2022) for annual.  10/17/2022, [facility] Lamont Snowball, CNM  10/15/2021 9:02 AM   10/17/2021 [facility] 10/15/2021, 9:02 AM

## 2021-10-20 ENCOUNTER — Telehealth: Payer: Self-pay | Admitting: Adult Health

## 2021-10-20 DIAGNOSIS — E039 Hypothyroidism, unspecified: Secondary | ICD-10-CM

## 2021-10-24 NOTE — Telephone Encounter (Signed)
Pt called in requesting refill on medication (levothyroxine (SYNTHROID) 88 MCG tablet). Pt stated that her pharmacy tried to reach out to Np Flinchum about the refill. Pt requesting callback

## 2021-10-24 NOTE — Telephone Encounter (Signed)
Patient has not had TSH ion over a year scheduled patient for 10/28/21 advised will refill levothyroxine once labs result patient agreed to plan.

## 2021-10-28 ENCOUNTER — Other Ambulatory Visit (INDEPENDENT_AMBULATORY_CARE_PROVIDER_SITE_OTHER): Payer: BC Managed Care – PPO

## 2021-10-28 ENCOUNTER — Other Ambulatory Visit: Payer: Self-pay

## 2021-10-28 DIAGNOSIS — E785 Hyperlipidemia, unspecified: Secondary | ICD-10-CM

## 2021-10-28 DIAGNOSIS — L719 Rosacea, unspecified: Secondary | ICD-10-CM | POA: Diagnosis not present

## 2021-10-28 DIAGNOSIS — E039 Hypothyroidism, unspecified: Secondary | ICD-10-CM

## 2021-10-28 DIAGNOSIS — E559 Vitamin D deficiency, unspecified: Secondary | ICD-10-CM | POA: Diagnosis not present

## 2021-10-29 LAB — CBC WITH DIFFERENTIAL/PLATELET
Basophils Absolute: 0.1 10*3/uL (ref 0.0–0.1)
Basophils Relative: 0.9 % (ref 0.0–3.0)
Eosinophils Absolute: 0.2 10*3/uL (ref 0.0–0.7)
Eosinophils Relative: 3 % (ref 0.0–5.0)
HCT: 34.4 % — ABNORMAL LOW (ref 36.0–46.0)
Hemoglobin: 11.3 g/dL — ABNORMAL LOW (ref 12.0–15.0)
Lymphocytes Relative: 25.7 % (ref 12.0–46.0)
Lymphs Abs: 1.5 10*3/uL (ref 0.7–4.0)
MCHC: 32.9 g/dL (ref 30.0–36.0)
MCV: 86.6 fl (ref 78.0–100.0)
Monocytes Absolute: 0.7 10*3/uL (ref 0.1–1.0)
Monocytes Relative: 12 % (ref 3.0–12.0)
Neutro Abs: 3.4 10*3/uL (ref 1.4–7.7)
Neutrophils Relative %: 58.4 % (ref 43.0–77.0)
Platelets: 198 10*3/uL (ref 150.0–400.0)
RBC: 3.97 Mil/uL (ref 3.87–5.11)
RDW: 14.4 % (ref 11.5–15.5)
WBC: 5.7 10*3/uL (ref 4.0–10.5)

## 2021-10-29 LAB — COMPREHENSIVE METABOLIC PANEL
ALT: 12 U/L (ref 0–35)
AST: 20 U/L (ref 0–37)
Albumin: 4 g/dL (ref 3.5–5.2)
Alkaline Phosphatase: 71 U/L (ref 39–117)
BUN: 14 mg/dL (ref 6–23)
CO2: 26 mEq/L (ref 19–32)
Calcium: 9 mg/dL (ref 8.4–10.5)
Chloride: 102 mEq/L (ref 96–112)
Creatinine, Ser: 0.85 mg/dL (ref 0.40–1.20)
GFR: 74.59 mL/min (ref >60.00)
Glucose, Bld: 86 mg/dL (ref 70–99)
Potassium: 3.7 mEq/L (ref 3.5–5.1)
Sodium: 139 mEq/L (ref 135–145)
Total Bilirubin: 0.3 mg/dL (ref 0.2–1.2)
Total Protein: 7 g/dL (ref 6.0–8.3)

## 2021-10-29 LAB — LIPID PANEL
Cholesterol: 193 mg/dL (ref 0–200)
HDL: 48.9 mg/dL (ref >39.00)
LDL Cholesterol: 124 mg/dL — ABNORMAL HIGH (ref 0–99)
NonHDL: 144.15
Total CHOL/HDL Ratio: 4
Triglycerides: 99 mg/dL (ref 0.0–149.0)
VLDL: 19.8 mg/dL (ref 0.0–40.0)

## 2021-10-29 LAB — VITAMIN D 25 HYDROXY (VIT D DEFICIENCY, FRACTURES): VITD: 23.42 ng/mL — ABNORMAL LOW (ref 30.00–100.00)

## 2021-10-29 LAB — TSH: TSH: 5.25 u[IU]/mL (ref 0.35–5.50)

## 2021-11-03 ENCOUNTER — Telehealth (INDEPENDENT_AMBULATORY_CARE_PROVIDER_SITE_OTHER): Payer: Self-pay

## 2021-11-03 MED ORDER — LEVOTHYROXINE SODIUM 88 MCG PO TABS: 88.0000 ug | ORAL_TABLET | Freq: Every day | ORAL | 3 refills | Status: AC

## 2021-11-03 NOTE — Telephone Encounter (Signed)
The  pt called and left a VM on the nurse's line saying that she has a knot on her Rt calf about the size of a quarter. The  knot is tender to the touch , red and purplish in color, the pt has no no heat to the touch of the area and the area is swollen . The pt got her 1st foam sclero in May that's when she noticed the knot and she had cellulitis in April. Per the NP the pt needs to be scheduled for a Reflux U/S and OFC visit with [facility] Cowboy or Arna Medici.

## 2021-11-03 NOTE — Addendum Note (Signed)
Addended by: Dennie Bible on: 11/03/2021 03:52 PM   Modules accepted: Orders

## 2021-11-03 NOTE — Progress Notes (Signed)
Vitaimin D is low, would recommend her taking Vitamin D 3 at 2,000 international units once daily.  TSH for thyroid is within normal.  LDL elevated.  Discuss lifestyle modification with patient e.g. increase exercise, fiber, fruits, vegetables, lean meat, and omega 3/fish intake and decrease saturated fat.  If patient following strict diet and exercise program already please schedule follow up appointment with primary care physician   CBC shows slightly low hemoglobin and hematocrit - verify any bleeding ? May need work up with gastroenterology if no endoscopy recently. CMP is within normal limits.   Can recheck vitamin D in 3  months. Would advise to have her come for Ferritin TIBC with [facility] as well to check for iron saturation.

## 2021-11-04 ENCOUNTER — Other Ambulatory Visit: Payer: Self-pay

## 2021-11-04 DIAGNOSIS — G5603 Carpal tunnel syndrome, bilateral upper limbs: Secondary | ICD-10-CM | POA: Diagnosis not present

## 2021-11-04 DIAGNOSIS — G5633 Lesion of radial nerve, bilateral upper limbs: Secondary | ICD-10-CM | POA: Diagnosis not present

## 2021-11-04 DIAGNOSIS — E559 Vitamin D deficiency, unspecified: Secondary | ICD-10-CM

## 2021-11-04 NOTE — Telephone Encounter (Signed)
error 

## 2021-11-12 ENCOUNTER — Ambulatory Visit (INDEPENDENT_AMBULATORY_CARE_PROVIDER_SITE_OTHER): Payer: BC Managed Care – PPO | Admitting: Nurse Practitioner

## 2021-11-12 ENCOUNTER — Other Ambulatory Visit: Payer: Self-pay

## 2021-11-12 ENCOUNTER — Encounter (INDEPENDENT_AMBULATORY_CARE_PROVIDER_SITE_OTHER): Payer: Self-pay | Admitting: Nurse Practitioner

## 2021-11-12 ENCOUNTER — Ambulatory Visit (INDEPENDENT_AMBULATORY_CARE_PROVIDER_SITE_OTHER): Payer: BC Managed Care – PPO

## 2021-11-12 ENCOUNTER — Other Ambulatory Visit (INDEPENDENT_AMBULATORY_CARE_PROVIDER_SITE_OTHER): Payer: Self-pay | Admitting: Nurse Practitioner

## 2021-11-12 VITALS — BP 115/74 | HR 80 | Resp 16 | Wt 151.0 lb

## 2021-11-12 DIAGNOSIS — I8001 Phlebitis and thrombophlebitis of superficial vessels of right lower extremity: Secondary | ICD-10-CM

## 2021-11-12 DIAGNOSIS — Z9889 Other specified postprocedural states: Secondary | ICD-10-CM

## 2021-11-12 DIAGNOSIS — M79604 Pain in right leg: Secondary | ICD-10-CM

## 2021-11-17 ENCOUNTER — Encounter (INDEPENDENT_AMBULATORY_CARE_PROVIDER_SITE_OTHER): Payer: Self-pay | Admitting: Nurse Practitioner

## 2021-11-17 NOTE — Progress Notes (Signed)
Subjective:    Patient ID: Hannah [name], female    DOB: 11-16-1960, 61 y.o.   MRN: [id number] Chief Complaint  Patient presents with   Follow-up    Ultrasound follow up    Casandra Schoeff is a 61 year old female that presents today for evaluation of pain in her right lower extremity.  The patient has previously had a left small saphenous vein ablation.  She notes this not remotely and she notes that she does travel frequently.  She flies several times per month for several hours.  She notes that initially the area was very sore and tender and red.  That has greatly decreased at this time but understandably the patient is still concerned.  Today noninvasive studies show no evidence of reflux in the right great saphenous vein however there is a near occlusive acute to subacute thrombus in the small saphenous vein.   Review of Systems  Cardiovascular:  Positive for leg swelling.  All other systems reviewed and are negative.     Objective:   Physical Exam Vitals reviewed.  Cardiovascular:     Rate and Rhythm: Normal rate.     Pulses: Normal pulses.  Pulmonary:     Effort: Pulmonary effort is normal.  Skin:    General: Skin is warm and dry.  Neurological:     Mental Status: She is alert and oriented to person, place, and time.  Psychiatric:        Mood and Affect: Mood normal.        Behavior: Behavior normal.        Thought Content: Thought content normal.        Judgment: Judgment normal.    BP 115/74 (BP Location: Right Arm)    Pulse 80    Resp 16    Wt 151 lb (68.5 kg)    BMI 25.13 kg/m   Past Medical History:  Diagnosis Date   Carpal tunnel syndrome, bilateral    Chickenpox    GERD (gastroesophageal reflux disease)    Hypothyroidism 11/28/2019   Migraine    Thyroid disease    Varicose veins of right lower extremity 11/28/2019    Social History   Socioeconomic History   Marital status: Legally Separated    Spouse name: Not on file   Number of children: Not on file    Years of education: Not on file   Highest education level: Not on file  Occupational History   Not on file  Tobacco Use   Smoking status: Never   Smokeless tobacco: Never  Vaping Use   Vaping Use: Never used  Substance and Sexual Activity   Alcohol use: Never   Drug use: Never   Sexual activity: Not Currently    Birth control/protection: Post-menopausal  Other Topics Concern   Not on file  Social History Narrative   Not on file   Social Determinants of Health   Financial Resource Strain: Not on file  Food Insecurity: Not on file  Transportation Needs: Not on file  Physical Activity: Not on file  Stress: Not on file  Social Connections: Not on file  Intimate Partner Violence: Not on file    Past Surgical History:  Procedure Laterality Date   [place]    Family History  Problem Relation Age of Onset   Arthritis Mother    Hearing loss Mother    Alcohol abuse Father    Arthritis Father    Cancer Father  Depression Father    Hearing loss Father    Heart attack Father    Hyperlipidemia Father    Stroke Father    Hyperlipidemia Sister    Colon polyps Sister    Hyperlipidemia Brother    Hypertension Brother    Breast cancer Paternal Aunt     Allergies  Allergen Reactions   Codeine Nausea And Vomiting and Rash    CBC Latest Ref Rng & Units 10/28/2021 10/17/2020 02/21/2020  WBC 4.0 - 10.5 K/uL 5.7 5.0 4.7  Hemoglobin 12.0 - 15.0 g/dL 11.3(L) 12.1 12.2  Hematocrit 36.0 - 46.0 % 34.4(L) 36.5 36.5  Platelets 150.0 - 400.0 K/uL 198.0 218.0 215.0      CMP     Component Value Date/Time   NA 139 10/28/2021 1606   K 3.7 10/28/2021 1606   CL 102 10/28/2021 1606   CO2 26 10/28/2021 1606   GLUCOSE 86 10/28/2021 1606   BUN 14 10/28/2021 1606   CREATININE 0.85 10/28/2021 1606   CALCIUM 9.0 10/28/2021 1606   PROT 7.0 10/28/2021 1606   ALBUMIN 4.0 10/28/2021 1606   AST 20 10/28/2021 1606   ALT 12 10/28/2021 1606   ALKPHOS 71  10/28/2021 1606   BILITOT 0.3 10/28/2021 1606     No results found.     Assessment & Plan:   1. Superficial phlebitis and thrombophlebitis of right lower extremity Fortunately the superficial thrombophlebitis has decreased greatly in pain but evidence is still prominent on ultrasound.  The patient does have upcoming flights as well as surgeries planned.  Fortunately these are not large orthopedic surgeries.  Because of this we will have the patient's start on 2.5 mg of Eliquis to take twice a day.  She is instructed that she should stop the Eliquis 3 days prior to her surgeries and restarted 2 days after.  She should take Eliquis until it is out.  She is also advised to utilize compression socks while on flights.  The patient return in approximately 6 weeks to reevaluate the area patient should contact the office if she begins to have any issues in the interim.   Current Outpatient Medications on File Prior to Visit  Medication Sig Dispense Refill   aspirin EC 81 MG tablet Take 81 mg by mouth daily.     cholecalciferol (VITAMIN D3) 25 MCG (1000 UNIT) tablet Take 1,000 Units by mouth daily.     cyanocobalamin 1000 MCG tablet Take by mouth.     levothyroxine (SYNTHROID) 88 MCG tablet Take 1 tablet (88 mcg total) by mouth daily. 90 tablet 3   Magnesium 500 MG CAPS Take by mouth daily.     pyridOXINE (VITAMIN B-6) 100 MG tablet Take 100 mg by mouth daily.     doxycycline (ORACEA) 40 MG capsule Take 1 capsule (40 mg total) by mouth every morning. 30 capsule 1   No current facility-administered medications on file prior to visit.    There are no Patient Instructions on file for this visit. No follow-ups on file.   Kris Hartmann, NP

## 2021-11-21 DIAGNOSIS — G5622 Lesion of ulnar nerve, left upper limb: Secondary | ICD-10-CM | POA: Diagnosis not present

## 2021-11-21 DIAGNOSIS — G5602 Carpal tunnel syndrome, left upper limb: Secondary | ICD-10-CM | POA: Diagnosis not present

## 2021-11-21 DIAGNOSIS — M25532 Pain in left wrist: Secondary | ICD-10-CM | POA: Diagnosis not present

## 2021-11-21 DIAGNOSIS — Z7982 Long term (current) use of aspirin: Secondary | ICD-10-CM | POA: Diagnosis not present

## 2021-11-21 DIAGNOSIS — G8918 Other acute postprocedural pain: Secondary | ICD-10-CM | POA: Diagnosis not present

## 2021-11-21 DIAGNOSIS — E039 Hypothyroidism, unspecified: Secondary | ICD-10-CM | POA: Diagnosis not present

## 2021-12-11 DIAGNOSIS — G8918 Other acute postprocedural pain: Secondary | ICD-10-CM | POA: Diagnosis not present

## 2021-12-11 DIAGNOSIS — G5601 Carpal tunnel syndrome, right upper limb: Secondary | ICD-10-CM | POA: Diagnosis not present

## 2021-12-11 DIAGNOSIS — G5631 Lesion of radial nerve, right upper limb: Secondary | ICD-10-CM | POA: Diagnosis not present

## 2021-12-11 DIAGNOSIS — G5621 Lesion of ulnar nerve, right upper limb: Secondary | ICD-10-CM | POA: Diagnosis not present

## 2021-12-11 DIAGNOSIS — M79601 Pain in right arm: Secondary | ICD-10-CM | POA: Diagnosis not present

## 2021-12-18 DIAGNOSIS — G5631 Lesion of radial nerve, right upper limb: Secondary | ICD-10-CM | POA: Diagnosis not present

## 2021-12-18 DIAGNOSIS — G5601 Carpal tunnel syndrome, right upper limb: Secondary | ICD-10-CM | POA: Diagnosis not present

## 2021-12-29 ENCOUNTER — Other Ambulatory Visit (INDEPENDENT_AMBULATORY_CARE_PROVIDER_SITE_OTHER): Payer: Self-pay | Admitting: Nurse Practitioner

## 2021-12-29 DIAGNOSIS — I8001 Phlebitis and thrombophlebitis of superficial vessels of right lower extremity: Secondary | ICD-10-CM

## 2021-12-30 ENCOUNTER — Encounter (INDEPENDENT_AMBULATORY_CARE_PROVIDER_SITE_OTHER): Payer: Self-pay | Admitting: Nurse Practitioner

## 2021-12-30 ENCOUNTER — Ambulatory Visit (INDEPENDENT_AMBULATORY_CARE_PROVIDER_SITE_OTHER): Payer: BC Managed Care – PPO

## 2021-12-30 ENCOUNTER — Ambulatory Visit (INDEPENDENT_AMBULATORY_CARE_PROVIDER_SITE_OTHER): Payer: BC Managed Care – PPO | Admitting: Nurse Practitioner

## 2021-12-30 VITALS — BP 115/69 | HR 96 | Resp 16 | Wt 155.0 lb

## 2021-12-30 DIAGNOSIS — I8001 Phlebitis and thrombophlebitis of superficial vessels of right lower extremity: Secondary | ICD-10-CM

## 2021-12-30 DIAGNOSIS — E785 Hyperlipidemia, unspecified: Secondary | ICD-10-CM

## 2022-01-11 ENCOUNTER — Encounter (INDEPENDENT_AMBULATORY_CARE_PROVIDER_SITE_OTHER): Payer: Self-pay | Admitting: Nurse Practitioner

## 2022-01-11 NOTE — Progress Notes (Signed)
? ?Subjective:  ? ? Patient ID: Hannah [name], female    DOB: March 12, 1961, 61 y.o.   MRN: [id number] ?Chief Complaint  ?Patient presents with  ? Follow-up  ?  Ultrasound follow up  ? ? ?Hannah [name] is a 61 year old female that presents today for evaluation of pain in her right lower extremity.  The patient has previously had a left small saphenous vein ablation.  She notes this not remotely and she notes that she does travel frequently.  She flies several times per month for several hours.  She notes that initially the area was very sore and tender and red.  That has greatly decreased at this time but understandably the patient is still concerned.  Previously it was noted that the patient had a superficial thrombophlebitis in the right lower extremity.  She has been on low-dose Eliquis without any further recurrence or issues with bleeding.  She is also recently had bilateral carpal tunnel surgeries.  Noninvasive studies today show resolution of the right lower extremity thrombophlebitis.  No evidence of DVT noted in the lower extremities. ? ? ?Review of Systems  ?Cardiovascular:  Negative for leg swelling.  ?All other systems reviewed and are negative. ? ?   ?Objective:  ? Physical Exam ?Vitals reviewed.  ?HENT:  ?   Head: Normocephalic.  ?Cardiovascular:  ?   Rate and Rhythm: Normal rate.  ?   Pulses: Normal pulses.  ?Pulmonary:  ?   Effort: Pulmonary effort is normal.  ?Musculoskeletal:  ?   Right lower leg: No edema.  ?Skin: ?   General: Skin is warm and dry.  ?Neurological:  ?   Mental Status: She is alert and oriented to person, place, and time.  ?Psychiatric:     ?   Mood and Affect: Mood normal.     ?   Behavior: Behavior normal.     ?   Thought Content: Thought content normal.     ?   Judgment: Judgment normal.  ? ? ?BP 115/69 (BP Location: Right Arm)   Pulse 96   Resp 16   Wt 155 lb (70.3 kg)   BMI 25.79 kg/m?  ? ?Past Medical History:  ?Diagnosis Date  ? Carpal tunnel syndrome, bilateral   ? Chickenpox    ? GERD (gastroesophageal reflux disease)   ? Hypothyroidism 11/28/2019  ? Migraine   ? Thyroid disease   ? Varicose veins of right lower extremity 11/28/2019  ? ? ?Social History  ? ?Socioeconomic History  ? Marital status: Legally Separated  ?  Spouse name: Not on file  ? Number of children: Not on file  ? Years of education: Not on file  ? Highest education level: Not on file  ?Occupational History  ? Not on file  ?Tobacco Use  ? Smoking status: Never  ? Smokeless tobacco: Never  ?Vaping Use  ? Vaping Use: Never used  ?Substance and Sexual Activity  ? Alcohol use: Never  ? Drug use: Never  ? Sexual activity: Not Currently  ?  Birth control/protection: Post-menopausal  ?Other Topics Concern  ? Not on file  ?Social History Narrative  ? Not on file  ? ?Social Determinants of Health  ? ?Financial Resource Strain: Not on file  ?Food Insecurity: Not on file  ?Transportation Needs: Not on file  ?Physical Activity: Not on file  ?Stress: Not on file  ?Social Connections: Not on file  ?Intimate Partner Violence: Not on file  ? ? ?Past Surgical History:  ?Procedure Laterality Date  ?  HERNIA REPAIR  1990  ? TONSILLECTOMY  1965  ? ? ?Family History  ?Problem Relation Age of Onset  ? Arthritis Mother   ? Hearing loss Mother   ? Alcohol abuse Father   ? Arthritis Father   ? Cancer Father   ? Depression Father   ? Hearing loss Father   ? Heart attack Father   ? Hyperlipidemia Father   ? Stroke Father   ? Hyperlipidemia Sister   ? Colon polyps Sister   ? Hyperlipidemia Brother   ? Hypertension Brother   ? Breast cancer Paternal Aunt   ? ? ?Allergies  ?Allergen Reactions  ? Codeine Nausea And Vomiting and Rash  ? ? ? ?  Latest Ref Rng & Units 10/28/2021  ?  4:06 PM 10/17/2020  ? 11:11 AM 02/21/2020  ?  8:48 AM  ?CBC  ?WBC 4.0 - 10.5 K/uL 5.7   5.0   4.7    ?Hemoglobin 12.0 - 15.0 g/dL 62.5   63.8   93.7    ?Hematocrit 36.0 - 46.0 % 34.4   36.5   36.5    ?Platelets 150.0 - 400.0 K/uL 198.0   218.0   215.0    ? ? ? ? ?CMP  ?   ?Component  Value Date/Time  ? NA 139 10/28/2021 1606  ? K 3.7 10/28/2021 1606  ? CL 102 10/28/2021 1606  ? CO2 26 10/28/2021 1606  ? GLUCOSE 86 10/28/2021 1606  ? BUN 14 10/28/2021 1606  ? CREATININE 0.85 10/28/2021 1606  ? CALCIUM 9.0 10/28/2021 1606  ? PROT 7.0 10/28/2021 1606  ? ALBUMIN 4.0 10/28/2021 1606  ? AST 20 10/28/2021 1606  ? ALT 12 10/28/2021 1606  ? ALKPHOS 71 10/28/2021 1606  ? BILITOT 0.3 10/28/2021 1606  ? ? ? ?No results found. ? ?   ?Assessment & Plan:  ? ?1. Superficial phlebitis and thrombophlebitis of right lower extremity ?Evidence of thrombophlebitis is resolved.  Patient is advised to finish all Eliquis and then transition to a daily baby aspirin.  She is advised utilize medical grade compression for when she has long drives or flights.  Patient is advised to follow-up with [place] on an as-needed basis. ? ?2. Hyperlipidemia, unspecified hyperlipidemia type ?Continue statin as ordered and reviewed, no changes at this time  ? ? ?Current Outpatient Medications on File Prior to Visit  ?Medication Sig Dispense Refill  ? aspirin EC 81 MG tablet Take 81 mg by mouth daily.    ? cholecalciferol (VITAMIN D3) 25 MCG (1000 UNIT) tablet Take 1,000 Units by mouth daily.    ? cyanocobalamin 1000 MCG tablet Take by mouth.    ? levothyroxine (SYNTHROID) 88 MCG tablet Take 1 tablet (88 mcg total) by mouth daily. 90 tablet 3  ? Magnesium 500 MG CAPS Take by mouth daily.    ? pyridOXINE (VITAMIN B-6) 100 MG tablet Take 100 mg by mouth daily.    ? doxycycline (ORACEA) 40 MG capsule Take 1 capsule (40 mg total) by mouth every morning. 30 capsule 1  ? ?No current facility-administered medications on file prior to visit.  ? ? ?There are no Patient Instructions on file for this visit. ?No follow-ups on file. ? ? ?Georgiana Spinner, NP ? ? ?

## 2022-08-25 DIAGNOSIS — I83811 Varicose veins of right lower extremities with pain: Secondary | ICD-10-CM | POA: Diagnosis not present

## 2022-08-25 DIAGNOSIS — E559 Vitamin D deficiency, unspecified: Secondary | ICD-10-CM | POA: Diagnosis not present

## 2022-08-25 DIAGNOSIS — E039 Hypothyroidism, unspecified: Secondary | ICD-10-CM | POA: Diagnosis not present

## 2022-08-25 DIAGNOSIS — E785 Hyperlipidemia, unspecified: Secondary | ICD-10-CM | POA: Diagnosis not present

## 2022-12-09 DIAGNOSIS — Z1389 Encounter for screening for other disorder: Secondary | ICD-10-CM | POA: Diagnosis not present

## 2022-12-09 DIAGNOSIS — E785 Hyperlipidemia, unspecified: Secondary | ICD-10-CM | POA: Diagnosis not present

## 2022-12-09 DIAGNOSIS — Z131 Encounter for screening for diabetes mellitus: Secondary | ICD-10-CM | POA: Diagnosis not present

## 2022-12-09 DIAGNOSIS — Z0000 Encounter for general adult medical examination without abnormal findings: Secondary | ICD-10-CM | POA: Diagnosis not present

## 2022-12-09 DIAGNOSIS — E559 Vitamin D deficiency, unspecified: Secondary | ICD-10-CM | POA: Diagnosis not present

## 2023-01-19 DIAGNOSIS — E039 Hypothyroidism, unspecified: Secondary | ICD-10-CM | POA: Diagnosis not present

## 2023-01-19 DIAGNOSIS — Z0000 Encounter for general adult medical examination without abnormal findings: Secondary | ICD-10-CM | POA: Diagnosis not present

## 2023-01-19 DIAGNOSIS — E559 Vitamin D deficiency, unspecified: Secondary | ICD-10-CM | POA: Diagnosis not present

## 2023-01-19 DIAGNOSIS — E785 Hyperlipidemia, unspecified: Secondary | ICD-10-CM | POA: Diagnosis not present

## 2023-02-05 ENCOUNTER — Other Ambulatory Visit: Payer: Self-pay | Admitting: Family Medicine

## 2023-02-05 DIAGNOSIS — Z1231 Encounter for screening mammogram for malignant neoplasm of breast: Secondary | ICD-10-CM

## 2023-03-23 ENCOUNTER — Ambulatory Visit
Admission: RE | Admit: 2023-03-23 | Discharge: 2023-03-23 | Disposition: A | Discharge status: Home / Self Care | Payer: BC Managed Care – PPO | Source: Ambulatory Visit | Attending: Family Medicine | Admitting: Family Medicine

## 2023-03-23 DIAGNOSIS — Z1231 Encounter for screening mammogram for malignant neoplasm of breast: Secondary | ICD-10-CM | POA: Diagnosis not present

## 2023-07-19 DIAGNOSIS — E785 Hyperlipidemia, unspecified: Secondary | ICD-10-CM | POA: Diagnosis not present

## 2023-07-27 DIAGNOSIS — E785 Hyperlipidemia, unspecified: Secondary | ICD-10-CM | POA: Diagnosis not present

## 2023-07-27 DIAGNOSIS — E039 Hypothyroidism, unspecified: Secondary | ICD-10-CM | POA: Diagnosis not present

## 2023-07-27 DIAGNOSIS — Z8249 Family history of ischemic heart disease and other diseases of the circulatory system: Secondary | ICD-10-CM | POA: Diagnosis not present

## 2023-07-29 ENCOUNTER — Other Ambulatory Visit: Payer: Self-pay | Admitting: Family Medicine

## 2023-07-29 DIAGNOSIS — E785 Hyperlipidemia, unspecified: Secondary | ICD-10-CM

## 2023-07-29 DIAGNOSIS — Z8249 Family history of ischemic heart disease and other diseases of the circulatory system: Secondary | ICD-10-CM

## 2023-08-09 ENCOUNTER — Ambulatory Visit
Admission: RE | Admit: 2023-08-09 | Discharge: 2023-08-09 | Disposition: A | Discharge status: Home / Self Care | Payer: BC Managed Care – PPO | Source: Ambulatory Visit | Attending: Family Medicine | Admitting: Family Medicine

## 2023-08-09 DIAGNOSIS — Z8249 Family history of ischemic heart disease and other diseases of the circulatory system: Secondary | ICD-10-CM | POA: Insufficient documentation

## 2023-08-09 DIAGNOSIS — E785 Hyperlipidemia, unspecified: Secondary | ICD-10-CM | POA: Insufficient documentation

## 2024-03-27 ENCOUNTER — Other Ambulatory Visit: Payer: Self-pay | Admitting: Family Medicine

## 2024-03-27 DIAGNOSIS — Z1231 Encounter for screening mammogram for malignant neoplasm of breast: Secondary | ICD-10-CM

## 2024-04-04 ENCOUNTER — Ambulatory Visit
Admission: RE | Admit: 2024-04-04 | Discharge: 2024-04-04 | Disposition: A | Discharge status: Home / Self Care | Source: Ambulatory Visit | Attending: Family Medicine | Admitting: Family Medicine

## 2024-04-04 DIAGNOSIS — Z1231 Encounter for screening mammogram for malignant neoplasm of breast: Secondary | ICD-10-CM | POA: Diagnosis present
